# Patient Record
Sex: Male | Born: 1935 | Race: White | Hispanic: No | Smoking: Current every day smoker
Health system: Southern US, Community
[De-identification: ages and names within clinical notes are randomized; demographics above are authoritative.]

## PROBLEM LIST (undated history)

## (undated) DIAGNOSIS — H3530 Unspecified macular degeneration: Secondary | ICD-10-CM

## (undated) DIAGNOSIS — I10 Essential (primary) hypertension: Secondary | ICD-10-CM

## (undated) DIAGNOSIS — H5440 Blindness, one eye, unspecified eye: Secondary | ICD-10-CM

## (undated) HISTORY — PX: COLONOSCOPY: SHX174

## (undated) HISTORY — PX: APPENDECTOMY: SHX54

## (undated) HISTORY — PX: JOINT REPLACEMENT: SHX530

## (undated) HISTORY — PX: ARTHROTOMY: SHX134

## (undated) HISTORY — PX: ELBOW FLEXORPLASTY: SUR611

## (undated) HISTORY — PX: PROSTATE SURGERY: SHX751

## (undated) HISTORY — PX: FRACTURE SURGERY: SHX138

## (undated) HISTORY — PX: ARM DEBRIDEMENT: SHX890

## (undated) HISTORY — PX: MUSCLE FLAP MYOCUTANEOUS / FASCIOCUTANEOUS OF TRUNK: SUR869

## (undated) HISTORY — PX: ESOPHAGOGASTRODUODENOSCOPY: SHX1529

## (undated) HISTORY — PX: TONSILECTOMY, ADENOIDECTOMY, BILATERAL MYRINGOTOMY AND TUBES: SHX2538

## (undated) HISTORY — PX: EXCISION BONE CYST: SHX6616

---

## 2003-09-14 ENCOUNTER — Other Ambulatory Visit: Payer: Self-pay

## 2004-08-24 ENCOUNTER — Ambulatory Visit: Payer: Self-pay | Admitting: Internal Medicine

## 2005-12-14 ENCOUNTER — Ambulatory Visit: Payer: Self-pay | Admitting: Gastroenterology

## 2008-01-24 ENCOUNTER — Ambulatory Visit: Payer: Self-pay | Admitting: Cardiology

## 2011-08-22 ENCOUNTER — Ambulatory Visit: Payer: Self-pay | Admitting: Ophthalmology

## 2011-10-19 ENCOUNTER — Ambulatory Visit: Payer: Self-pay | Admitting: Gastroenterology

## 2012-12-11 ENCOUNTER — Ambulatory Visit: Payer: Self-pay | Admitting: Unknown Physician Specialty

## 2013-01-08 ENCOUNTER — Ambulatory Visit: Payer: Self-pay

## 2013-03-11 ENCOUNTER — Ambulatory Visit: Payer: Self-pay

## 2013-12-18 DIAGNOSIS — I2510 Atherosclerotic heart disease of native coronary artery without angina pectoris: Secondary | ICD-10-CM | POA: Insufficient documentation

## 2017-06-26 DIAGNOSIS — N401 Enlarged prostate with lower urinary tract symptoms: Secondary | ICD-10-CM | POA: Insufficient documentation

## 2017-06-26 DIAGNOSIS — J441 Chronic obstructive pulmonary disease with (acute) exacerbation: Secondary | ICD-10-CM | POA: Insufficient documentation

## 2017-06-26 DIAGNOSIS — J449 Chronic obstructive pulmonary disease, unspecified: Secondary | ICD-10-CM | POA: Insufficient documentation

## 2017-06-26 DIAGNOSIS — R7303 Prediabetes: Secondary | ICD-10-CM | POA: Insufficient documentation

## 2017-06-26 DIAGNOSIS — I10 Essential (primary) hypertension: Secondary | ICD-10-CM | POA: Insufficient documentation

## 2017-06-26 DIAGNOSIS — G4733 Obstructive sleep apnea (adult) (pediatric): Secondary | ICD-10-CM | POA: Insufficient documentation

## 2017-06-26 DIAGNOSIS — E782 Mixed hyperlipidemia: Secondary | ICD-10-CM | POA: Insufficient documentation

## 2017-06-26 DIAGNOSIS — R351 Nocturia: Secondary | ICD-10-CM | POA: Insufficient documentation

## 2017-10-23 DIAGNOSIS — I480 Paroxysmal atrial fibrillation: Secondary | ICD-10-CM | POA: Insufficient documentation

## 2019-05-09 ENCOUNTER — Other Ambulatory Visit: Payer: Self-pay

## 2019-05-09 ENCOUNTER — Emergency Department: Payer: Medicare Other

## 2019-05-09 ENCOUNTER — Encounter: Payer: Self-pay | Admitting: Intensive Care

## 2019-05-09 ENCOUNTER — Emergency Department
Admission: EM | Admit: 2019-05-09 | Discharge: 2019-05-10 | Disposition: A | Discharge status: Home / Self Care | Payer: Medicare Other | Attending: Emergency Medicine | Admitting: Emergency Medicine

## 2019-05-09 DIAGNOSIS — S53105A Unspecified dislocation of left ulnohumeral joint, initial encounter: Secondary | ICD-10-CM | POA: Diagnosis not present

## 2019-05-09 DIAGNOSIS — Y999 Unspecified external cause status: Secondary | ICD-10-CM | POA: Diagnosis not present

## 2019-05-09 DIAGNOSIS — R2232 Localized swelling, mass and lump, left upper limb: Secondary | ICD-10-CM | POA: Insufficient documentation

## 2019-05-09 DIAGNOSIS — Y929 Unspecified place or not applicable: Secondary | ICD-10-CM | POA: Diagnosis not present

## 2019-05-09 DIAGNOSIS — Y939 Activity, unspecified: Secondary | ICD-10-CM | POA: Diagnosis not present

## 2019-05-09 DIAGNOSIS — Z79899 Other long term (current) drug therapy: Secondary | ICD-10-CM | POA: Insufficient documentation

## 2019-05-09 DIAGNOSIS — X58XXXA Exposure to other specified factors, initial encounter: Secondary | ICD-10-CM | POA: Diagnosis not present

## 2019-05-09 DIAGNOSIS — F17210 Nicotine dependence, cigarettes, uncomplicated: Secondary | ICD-10-CM | POA: Diagnosis not present

## 2019-05-09 DIAGNOSIS — S59902A Unspecified injury of left elbow, initial encounter: Secondary | ICD-10-CM | POA: Diagnosis present

## 2019-05-09 DIAGNOSIS — M25422 Effusion, left elbow: Secondary | ICD-10-CM

## 2019-05-09 DIAGNOSIS — I10 Essential (primary) hypertension: Secondary | ICD-10-CM | POA: Insufficient documentation

## 2019-05-09 HISTORY — DX: Blindness, one eye, unspecified eye: H54.40

## 2019-05-09 HISTORY — DX: Unspecified macular degeneration: H35.30

## 2019-05-09 HISTORY — DX: Essential (primary) hypertension: I10

## 2019-05-09 LAB — CBC WITH DIFFERENTIAL/PLATELET
Abs Immature Granulocytes: 0.05 10*3/uL (ref 0.00–0.07)
Basophils Absolute: 0 10*3/uL (ref 0.0–0.1)
Basophils Relative: 1 %
Eosinophils Absolute: 0 10*3/uL (ref 0.0–0.5)
Eosinophils Relative: 0 %
HCT: 45.9 % (ref 39.0–52.0)
Hemoglobin: 15.8 g/dL (ref 13.0–17.0)
Immature Granulocytes: 1 %
Lymphocytes Relative: 10 %
Lymphs Abs: 0.8 10*3/uL (ref 0.7–4.0)
MCH: 31.7 pg (ref 26.0–34.0)
MCHC: 34.4 g/dL (ref 30.0–36.0)
MCV: 92 fL (ref 80.0–100.0)
Monocytes Absolute: 1 10*3/uL (ref 0.1–1.0)
Monocytes Relative: 12 %
Neutro Abs: 6.2 10*3/uL (ref 1.7–7.7)
Neutrophils Relative %: 76 %
Platelets: 333 10*3/uL (ref 150–400)
RBC: 4.99 MIL/uL (ref 4.22–5.81)
RDW: 12.6 % (ref 11.5–15.5)
WBC: 8.1 10*3/uL (ref 4.0–10.5)
nRBC: 0 % (ref 0.0–0.2)

## 2019-05-09 LAB — COMPREHENSIVE METABOLIC PANEL
ALT: 24 U/L (ref 0–44)
AST: 25 U/L (ref 15–41)
Albumin: 3.3 g/dL — ABNORMAL LOW (ref 3.5–5.0)
Alkaline Phosphatase: 88 U/L (ref 38–126)
Anion gap: 13 (ref 5–15)
BUN: 23 mg/dL (ref 8–23)
CO2: 24 mmol/L (ref 22–32)
Calcium: 9.4 mg/dL (ref 8.9–10.3)
Chloride: 99 mmol/L (ref 98–111)
Creatinine, Ser: 1.29 mg/dL — ABNORMAL HIGH (ref 0.61–1.24)
GFR calc Af Amer: 59 mL/min — ABNORMAL LOW (ref >60)
GFR calc non Af Amer: 51 mL/min — ABNORMAL LOW (ref >60)
Glucose, Bld: 121 mg/dL — ABNORMAL HIGH (ref 70–99)
Potassium: 4.2 mmol/L (ref 3.5–5.1)
Sodium: 136 mmol/L (ref 135–145)
Total Bilirubin: 2.6 mg/dL — ABNORMAL HIGH (ref 0.3–1.2)
Total Protein: 7.3 g/dL (ref 6.5–8.1)

## 2019-05-09 LAB — LACTIC ACID, PLASMA: Lactic Acid, Venous: 1.5 mmol/L (ref 0.5–1.9)

## 2019-05-09 LAB — SEDIMENTATION RATE: Sed Rate: 73 mm/hr — ABNORMAL HIGH (ref 0–20)

## 2019-05-09 NOTE — ED Triage Notes (Signed)
Patient presents with severe left elbow/arm swelling, redness, and pain X2 weeks. HX same Patient states "It is fluid buildup" Reports he has never been to MD in past when this would flare up but it would always get better and states "It has never been this bad"

## 2019-05-09 NOTE — ED Notes (Signed)
Left elbow elevated on to a pillow - pt reports some relief

## 2019-05-09 NOTE — ED Provider Notes (Signed)
[facility] Emergency Department Provider Note   ____________________________________________   I have reviewed the triage vital signs and the nursing notes.   HISTORY  Chief Complaint Arm Swelling (left)   History limited by: Not Limited   HPI Corey Wong is a 85 y.o. male who presents to the emergency department today because of concerns for left elbow swelling and discomfort.  Patient states he has had problems with his left elbow for years and years.  Usually will get some swelling in it but will go away after 2 to 3 days.  However this past 2 weeks he has had the swelling there.  He has noticed redness.  He broke his left elbow when he was 85 years old and then had a surgery in the early 60s he says to release a tendon to help his fingers.  Denies any recent trauma to his elbow.  Records reviewed. Per medical record review patient has a history of hypertension.  Past Medical History:  Diagnosis Date  . Blind left eye   . Hypertension   . Macular degeneration of right eye     There are no problems to display for this patient.   History reviewed. No pertinent surgical history.  Prior to Admission medications   Medication Sig Start Date End Date Taking? Authorizing Provider  atorvastatin (LIPITOR) 20 MG tablet Take 20 mg by mouth 1 day or 1 dose. 06/25/18  Yes [provider]  furosemide (LASIX) 20 MG tablet Take 40 mg by mouth 1 day or 1 dose. 06/27/18  Yes [provider]  metoprolol succinate (TOPROL-XL) 25 MG 24 hr tablet Take 25 mg by mouth 1 day or 1 dose. 06/25/18  Yes [provider]  albuterol (VENTOLIN HFA) 108 (90 Base) MCG/ACT inhaler Inhale 2 puffs into the lungs 4 (four) times daily as needed.    [provider]    Allergies Sulfa antibiotics  History reviewed. No pertinent family history.  Social History Social History   Tobacco Use  . Smoking status: Current Every Day Smoker    Types:  Cigarettes  . Smokeless tobacco: Never Used  Substance Use Topics  . Alcohol use: Never  . Drug use: Never    Review of Systems Constitutional: No fever/chills Eyes: No visual changes. ENT: No sore throat. Cardiovascular: Denies chest pain. Respiratory: Denies shortness of breath. Gastrointestinal: No abdominal pain.  No nausea, no vomiting.  No diarrhea.   Genitourinary: Negative for dysuria. Musculoskeletal: Positive for left elbow swelling and discomfort.  Skin: Positive for rash. Neurological: Negative for headaches, focal weakness or numbness.  ____________________________________________   PHYSICAL EXAM:  VITAL SIGNS: ED Triage Vitals  Enc Vitals Group     BP 05/09/19 1646 (!) 142/75     Pulse Rate 05/09/19 1646 95     Resp 05/09/19 1646 18     Temp 05/09/19 1646 98.5 F (36.9 C)     Temp Source 05/09/19 1646 Oral     SpO2 05/09/19 1646 95 %     Weight 05/09/19 1654 250 lb (113.4 kg)     Height 05/09/19 1654 5\' 8"  (1.727 m)     Head Circumference --      Peak Flow --      Pain Score 05/09/19 1654 10   Constitutional: Alert and oriented.  Eyes: Conjunctivae are normal.  ENT      Head: Normocephalic and atraumatic.      Nose: No congestion/rhinnorhea.      Mouth/Throat:  Mucous membranes are moist.      Neck: No stridor. Hematological/Lymphatic/Immunilogical: No cervical lymphadenopathy. Cardiovascular: Normal rate, regular rhythm.  No murmurs, rubs, or gallops.  Respiratory: Normal respiratory effort without tachypnea nor retractions. Breath sounds are clear and equal bilaterally. No wheezes/rales/rhonchi. Gastrointestinal: Soft and non tender. No rebound. No guarding.  Genitourinary: Deferred Musculoskeletal: Significantly swollen left elbow. Two areas of fluctuant swelling medially. Erythema overlying elbow.  Neurologic:  Normal speech and language. No gross focal neurologic deficits are appreciated.  Skin:  Skin is warm, dry and intact. No rash  noted. Psychiatric: Mood and affect are normal. Speech and behavior are normal. Patient exhibits appropriate insight and judgment.  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 Diffuse edema.  ____________________________________________   PROCEDURES  Procedures  ____________________________________________   INITIAL IMPRESSION / ASSESSMENT AND PLAN / ED COURSE  Pertinent labs & imaging results that were available during my care of the patient were reviewed by me and considered in my medical decision making (see chart for details).   Patient presented to the emergency department today because of concerns for left elbow swelling and discomfort.  Patient states he has a long history of left elbow issues and the knee will get swelling occasionally.  On exam patient does have significant swelling to the left elbow.  There are some areas that are concerning for fluctuance.  X-ray shows chronic distal location and destructive arthropathy.  I did discuss with Dr. Gaspar Bidding with orthopedics who reviewed the images. At this time he did not think any attempt at drainage would be beneficial. Recommended checking CRP. Did think patient would benefit from follow up with tertiary care. Will give patient for information for [facility] orthopedics. Will give patient prescription for antibiotics.    ____________________________________________   FINAL CLINICAL IMPRESSION(S) / ED DIAGNOSES  Final diagnoses:  Elbow swelling, left  Dislocation of left elbow, initial encounter     Note: This dictation was prepared with Dragon dictation. Any transcriptional errors that result from this  process are unintentional     Nance Pear, MD 05/10/19 0009

## 2019-05-09 NOTE — ED Triage Notes (Signed)
presents with left elbow pain and swelling

## 2019-05-10 ENCOUNTER — Telehealth: Payer: Self-pay | Admitting: Orthopaedic Surgery

## 2019-05-10 DIAGNOSIS — S53105A Unspecified dislocation of left ulnohumeral joint, initial encounter: Secondary | ICD-10-CM | POA: Diagnosis not present

## 2019-05-10 LAB — C-REACTIVE PROTEIN: CRP: 19.7 mg/dL — ABNORMAL HIGH (ref <1.0)

## 2019-05-10 MED ORDER — CEPHALEXIN 500 MG PO CAPS: 500.0000 mg | ORAL_CAPSULE | Freq: Four times a day (QID) | ORAL | 0 refills | Status: AC

## 2019-05-10 MED ORDER — CEPHALEXIN 500 MG PO CAPS
500.0000 mg | ORAL_CAPSULE | Freq: Once | ORAL | Status: AC
  Administered 2019-05-10: 500 mg via ORAL
  Filled 2019-05-10: qty 1

## 2019-05-10 NOTE — Telephone Encounter (Signed)
-  DELAYED ENTRY-  Spoke with ED provider, Dr. Derrill Kay, on the evening of 05/09/19. Pt presents w/ L elbow swelling. Per report the patient has had intermittent swelling for longer than he can remember. Notes that it is not worse than previous, just has lasted longer. No f/c/n/v systemic symptoms. Per report the patient is not ill appearing/feeling, he is afebrile, he is able to move his elbow at his baseline level and does not have a white count. I have reviewed his xrays and it appears he has a destructive arthropathy of his left elbow that is chronic. With his elevated inflammatory markers drawn after our conversation this would favor an acute exacerbation of chronic osteomyelitis without any clinical indications of sepsis vs less likely neuorpathic joint. Dr. Derrill Kay and I discussed that this is a complex case best suited for a tertiary care center with an upper extremity specialist and infectious disease. There is no indication for urgent I&D or transfer at this time, but I recommended strict return to ED precautions and close follow up. I do not recommend bedside I&D as this would only create a draining sinus for the patient and complicate treatment further for the definitive surgeon.   Mena Goes, MD

## 2019-05-10 NOTE — Discharge Instructions (Addendum)
It is very important that you follow up with [facility]. Please seek medical attention for any fevers, worsening pain or swelling, decreased sensation in your hand or any other new or concerning symptoms.

## 2019-05-10 NOTE — ED Notes (Signed)
No peripheral IV placed this visit.   Discharge instructions reviewed with patient. Questions fielded by this RN. Patient verbalizes understanding of instructions. Patient discharged home in stable condition per [facility]. No acute distress noted at time of discharge.

## 2019-05-10 NOTE — ED Notes (Signed)
Family updated as to patient's status.  Cyndi Lennert, called for pt pickup

## 2019-05-19 ENCOUNTER — Other Ambulatory Visit: Payer: Self-pay | Admitting: Gerontology

## 2019-05-19 ENCOUNTER — Other Ambulatory Visit: Payer: Self-pay

## 2019-05-19 ENCOUNTER — Ambulatory Visit
Admission: RE | Admit: 2019-05-19 | Discharge: 2019-05-19 | Disposition: A | Discharge status: Home / Self Care | Payer: Medicare Other | Source: Ambulatory Visit | Attending: Gerontology | Admitting: Gerontology

## 2019-05-19 DIAGNOSIS — M869 Osteomyelitis, unspecified: Secondary | ICD-10-CM

## 2019-05-19 MED ORDER — GADOBUTROL 1 MMOL/ML IV SOLN
10.0000 mL | Freq: Once | INTRAVENOUS | Status: AC | PRN
  Administered 2019-05-19: 10 mL via INTRAVENOUS

## 2019-05-29 ENCOUNTER — Ambulatory Visit: Payer: Medicare Other | Admitting: Infectious Diseases

## 2019-07-11 DIAGNOSIS — D62 Acute posthemorrhagic anemia: Secondary | ICD-10-CM | POA: Insufficient documentation

## 2019-07-11 DIAGNOSIS — I4510 Unspecified right bundle-branch block: Secondary | ICD-10-CM | POA: Insufficient documentation

## 2019-07-11 DIAGNOSIS — I5031 Acute diastolic (congestive) heart failure: Secondary | ICD-10-CM | POA: Insufficient documentation

## 2019-07-11 DIAGNOSIS — N183 Chronic kidney disease, stage 3 unspecified: Secondary | ICD-10-CM | POA: Insufficient documentation

## 2019-09-02 DIAGNOSIS — Z09 Encounter for follow-up examination after completed treatment for conditions other than malignant neoplasm: Secondary | ICD-10-CM | POA: Insufficient documentation

## 2019-10-08 ENCOUNTER — Ambulatory Visit
Admission: RE | Admit: 2019-10-08 | Discharge: 2019-10-08 | Disposition: A | Discharge status: Home / Self Care | Payer: Medicare Other | Source: Ambulatory Visit | Attending: Family Medicine | Admitting: Family Medicine

## 2019-10-08 ENCOUNTER — Encounter (INDEPENDENT_AMBULATORY_CARE_PROVIDER_SITE_OTHER): Payer: Self-pay

## 2019-10-08 ENCOUNTER — Other Ambulatory Visit: Payer: Self-pay

## 2019-10-08 ENCOUNTER — Other Ambulatory Visit: Payer: Self-pay | Admitting: Family Medicine

## 2019-10-08 DIAGNOSIS — M86622 Other chronic osteomyelitis, left  humerus: Secondary | ICD-10-CM | POA: Diagnosis present

## 2019-10-21 ENCOUNTER — Encounter (INDEPENDENT_AMBULATORY_CARE_PROVIDER_SITE_OTHER): Payer: Self-pay | Admitting: Vascular Surgery

## 2019-10-21 ENCOUNTER — Other Ambulatory Visit: Payer: Self-pay

## 2019-10-21 ENCOUNTER — Ambulatory Visit (INDEPENDENT_AMBULATORY_CARE_PROVIDER_SITE_OTHER): Payer: Medicare Other | Admitting: Vascular Surgery

## 2019-10-21 VITALS — BP 105/59 | HR 87 | Resp 16 | Ht 68.0 in | Wt 233.0 lb

## 2019-10-21 DIAGNOSIS — E782 Mixed hyperlipidemia: Secondary | ICD-10-CM | POA: Diagnosis not present

## 2019-10-21 DIAGNOSIS — H3530 Unspecified macular degeneration: Secondary | ICD-10-CM | POA: Insufficient documentation

## 2019-10-21 DIAGNOSIS — I10 Essential (primary) hypertension: Secondary | ICD-10-CM

## 2019-10-21 DIAGNOSIS — N183 Chronic kidney disease, stage 3 unspecified: Secondary | ICD-10-CM

## 2019-10-21 DIAGNOSIS — H409 Unspecified glaucoma: Secondary | ICD-10-CM | POA: Insufficient documentation

## 2019-10-21 DIAGNOSIS — I5033 Acute on chronic diastolic (congestive) heart failure: Secondary | ICD-10-CM | POA: Diagnosis not present

## 2019-10-21 DIAGNOSIS — N320 Bladder-neck obstruction: Secondary | ICD-10-CM | POA: Insufficient documentation

## 2019-10-21 DIAGNOSIS — F17200 Nicotine dependence, unspecified, uncomplicated: Secondary | ICD-10-CM | POA: Insufficient documentation

## 2019-10-21 DIAGNOSIS — M7989 Other specified soft tissue disorders: Secondary | ICD-10-CM

## 2019-10-21 DIAGNOSIS — R600 Localized edema: Secondary | ICD-10-CM | POA: Insufficient documentation

## 2019-10-21 NOTE — Progress Notes (Signed)
Patient ID: Corey Wong, male   DOB: 04-17-1934, 85 y.o.   MRN: [id number]  Chief Complaint  Patient presents with  . New Patient (Initial Visit)    left leg edema    HPI Corey Wong is a 85 y.o. male.  I am asked to see the patient by Dr. Harrington Challenger for evaluation of marked leg swelling.  The patient has significant medical comorbidities and has had fairly significant decline in his activity level and overall condition over many months.  He now states that he barely walks.  He was admitted with acute on chronic heart failure earlier this year and has chronic heart failure at a baseline.  He also has some degree of chronic kidney disease.  His leg swelling is quite prominent.  Apparently this has been an on and off problem for many years and he was seen in our office 7 years ago although we have no current records mediately available on that because that predates our current computer system.  Both legs are affected but the left leg may be a little worse.  He can barely lift his legs at this point.  The skin is tense and thickened with some blistering although I do not see any open wounds draining at this point.  He describes these as heavy and painful.  The swelling goes all the way up into the thighs.  The posterior portions of the thighs are quite firm and indurated where he sits in his wheelchair most of the day.  He does not really elevate his legs much.  Nothing that he can pinpoint makes this any better.   Past Medical History:  Diagnosis Date  . Blind left eye   . Hypertension   . Macular degeneration of right eye     Past Surgical History:  Procedure Laterality Date  . APPENDECTOMY    . ARM DEBRIDEMENT Left   . ARTHROTOMY    . COLONOSCOPY    . ELBOW FLEXORPLASTY    . ESOPHAGOGASTRODUODENOSCOPY    . EXCISION BONE CYST    . JOINT REPLACEMENT    . MUSCLE FLAP MYOCUTANEOUS / FASCIOCUTANEOUS OF TRUNK    . PROSTATE SURGERY    . TONSILECTOMY, ADENOIDECTOMY, BILATERAL MYRINGOTOMY  AND TUBES       Family History No bleeding disorders, clotting disorders, autoimmune diseases, or aneurysms   Social History   Tobacco Use  . Smoking status: Current Every Day Smoker    Types: Cigarettes  . Smokeless tobacco: Never Used  Substance Use Topics  . Alcohol use: Never  . Drug use: Never    Allergies  Allergen Reactions  . Linezolid Other (See Comments)    Sore on the inside of mouth and lips   . Sulfa Antibiotics Other (See Comments)    Other Reaction: hyperactivity    Current Outpatient Medications  Medication Sig Dispense Refill  . acetaminophen (TYLENOL) 325 MG tablet Take by mouth.    Marland Kitchen albuterol (VENTOLIN HFA) 108 (90 Base) MCG/ACT inhaler Inhale 2 puffs into the lungs 4 (four) times daily as needed.    Marland Kitchen aspirin 81 MG EC tablet Take by mouth.    Marland Kitchen atorvastatin (LIPITOR) 20 MG tablet Take 20 mg by mouth 1 day or 1 dose.    . brimonidine (ALPHAGAN P) 0.1 % SOLN Apply to eye.    . ciprofloxacin (CIPRO) 500 MG tablet Take 500 mg by mouth 2 (two) times daily.    . clindamycin (CLEOCIN) 150 MG capsule  Take 150 mg by mouth 3 (three) times daily.    . furosemide (LASIX) 20 MG tablet Take 40 mg by mouth 1 day or 1 dose.    Marland Kitchen. LUMIGAN 0.01 % SOLN     . metoprolol succinate (TOPROL-XL) 25 MG 24 hr tablet Take 25 mg by mouth 1 day or 1 dose.    Marland Kitchen. MIRAPEX ER 0.375 MG TB24 Take by mouth.    . polyethylene glycol powder (GLYCOLAX/MIRALAX) 17 GM/SCOOP powder Take by mouth.    . sertraline (ZOLOFT) 25 MG tablet Take 25 mg by mouth daily.    . Soft Lens Products (RA SALINE SOLUTION) SOLN     . timolol (TIMOPTIC) 0.5 % ophthalmic solution     . WIXELA INHUB 250-50 MCG/DOSE AEPB Inhale 1 puff into the lungs 2 (two) times daily.     No current facility-administered medications for this visit.      REVIEW OF SYSTEMS (Negative unless checked)  Constitutional: [] Weight loss  [] Fever  [] Chills Cardiac: [] Chest pain   [] Chest pressure   [x] Palpitations   [] Shortness of  breath when laying flat   [] Shortness of breath at rest   [x] Shortness of breath with exertion. Vascular:  [] Pain in legs with walking   [] Pain in legs at rest   [] Pain in legs when laying flat   [] Claudication   [] Pain in feet when walking  [] Pain in feet at rest  [] Pain in feet when laying flat   [] History of DVT   [] Phlebitis   [x] Swelling in legs   [] Varicose veins   [] Non-healing ulcers Pulmonary:   [] Uses home oxygen   [] Productive cough   [] Hemoptysis   [] Wheeze  [] COPD   [] Asthma Neurologic:  [] Dizziness  [] Blackouts   [] Seizures   [] History of stroke   [] History of TIA  [] Aphasia   [] Temporary blindness   [] Dysphagia   [] Weakness or numbness in arms   [] Weakness or numbness in legs Musculoskeletal:  [x] Arthritis   [] Joint swelling   [x] Joint pain   [] Low back pain Hematologic:  [] Easy bruising  [] Easy bleeding   [] Hypercoagulable state   [] Anemic  [] Hepatitis Gastrointestinal:  [] Blood in stool   [] Vomiting blood  [] Gastroesophageal reflux/heartburn   [] Abdominal pain Genitourinary:  [x] Chronic kidney disease   [] Difficult urination  [] Frequent urination  [] Burning with urination   [] Hematuria Skin:  [] Rashes   [] Ulcers   [] Wounds Psychological:  [] History of anxiety   []  History of major depression.    Physical Exam BP (!) 105/59 (BP Location: Right Arm)   Pulse 87   Resp 16   Ht 5\' 8"  (1.727 m)   Wt 233 lb (105.7 kg)   BMI 35.43 kg/m  Gen:  WD/WN, NAD.  Somewhat debilitated appearing Head: [place]/AT, No temporalis wasting. Ear/Nose/Throat: Hearing somewhat diminished, nares w/o erythema or drainage, oropharynx w/o Erythema/Exudate Eyes: Conjunctiva clear, sclera non-icteric  Neck: trachea midline.  No JVD.  Pulmonary:  Good air movement, respirations not labored, no use of accessory muscles  Cardiac: Somewhat irregular Vascular:  Vessel Right Left  Radial Palpable Palpable                          DP  NP  NP  PT  NP  NP   Gastrointestinal:. No masses, surgical  incisions, or scars. Musculoskeletal: M/S 5/5 throughout.  Extremities without ischemic changes.  No deformity or atrophy.  In a wheelchair.  Marked stasis dermatitis changes are present bilaterally with some blistering  and induration of the skin.  3+ right lower extremity edema, 3-4+ left lower extremity edema. Neurologic: Sensation grossly intact in extremities.  Symmetrical.  Speech is fluent. Motor exam as listed above. Psychiatric: Judgment intact, Mood & affect appropriate for pt's clinical situation. Dermatologic: No rashes or ulcers noted.  No cellulitis or open wounds.    Radiology US Venous Img Upper Uni Left (DVT)  Result Date: 10/08/2019 CLINICAL DATA:  Pain and edema the left upper extremity with history of osteomyelitis of the left elbow. EXAM: LEFT UPPER EXTREMITY VENOUS DOPPLER ULTRASOUND TECHNIQUE: Gray-scale sonography with graded compression, as well as color Doppler and duplex ultrasound were performed to evaluate the upper extremity deep venous system from the level of the subclavian vein and including the jugular, axillary, basilic, radial, ulnar and upper cephalic vein. Spectral Doppler was utilized to evaluate flow at rest and with distal augmentation maneuvers. COMPARISON:  MRI of the left elbow on 05/19/2019 FINDINGS: Contralateral Subclavian Vein: Respiratory phasicity is normal and symmetric with the symptomatic side. No evidence of thrombus. Normal compressibility. Internal Jugular Vein: No evidence of thrombus. Normal compressibility, respiratory phasicity and response to augmentation. Subclavian Vein: No evidence of thrombus. Normal compressibility, respiratory phasicity and response to augmentation. Axillary Vein: No evidence of thrombus. Normal compressibility, respiratory phasicity and response to augmentation. Cephalic Vein: No evidence of thrombus. Normal compressibility, respiratory phasicity and response to augmentation. Basilic Vein: No evidence of thrombus. Normal  compressibility, respiratory phasicity and response to augmentation. Brachial Veins: No evidence of thrombus. Normal compressibility, respiratory phasicity and response to augmentation. Radial Veins: No evidence of thrombus. Normal compressibility, respiratory phasicity and response to augmentation. Ulnar Veins: No evidence of thrombus. Normal compressibility, respiratory phasicity and response to augmentation. Venous Reflux:  None visualized. Other Findings: No evidence of superficial thrombophlebitis. There are visible complex fluid collection centered around the left elbow. IMPRESSION: 1. No evidence of DVT in the left upper extremity. 2. 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 Electronically Signed   By: Irish Lack M.D.   On: 10/08/2019 15:54    Labs No results found for this or any previous visit (from the past 2160 hour(s)).  Assessment/Plan:  Essential hypertension blood pressure control important in reducing the progression of atherosclerotic disease. On appropriate oral medications.   Mixed hyperlipidemia lipid control important in reducing the progression of atherosclerotic disease. Continue statin therapy   Chronic kidney disease (CKD), stage III (moderate) Certainly can be a contributing factor to his lower extremity swelling.  Also has to be careful with the use of diuretics for fear of renal insufficiency.  Heart failure, diastolic, acute on chronic (HCC) Poor cardiac function can certainly worsen lower extremity swelling.  Swelling of limb I have had a long discussion with the patient regarding swelling and why it  causes symptoms.  His swelling is far too severe currently to realistically expect him to get compression stockings on.  We are going to wrap both lower extremities in Unna boots today and plan to change these weekly.  Once he comes out of the  [employer], patient will begin wearing graduated compression stockings class 1 (20-30 mmHg) on a daily basis a prescription was given. The patient will  beginning wearing the stockings first thing in the morning and removing them in the evening. The patient is instructed specifically not to sleep in the stockings.   In addition, behavioral modification will  be initiated.  This will include frequent elevation, use of over the counter pain medications and exercise such as walking.  I have reviewed systemic causes for chronic edema such as liver, kidney and cardiac etiologies.  The patient denies problems with these organ systems.    Consideration for a lymph pump will also be made based upon the effectiveness of conservative therapy.  This would help to improve the edema control and prevent sequela such as ulcers and infections   Patient should undergo duplex ultrasound of the venous system to ensure that DVT or reflux is not present.  The patient will follow-up with me after the ultrasound.        Festus Barren 10/21/2019, 1:34 PM   This note was created with Dragon medical transcription system.  Any errors from dictation are unintentional.

## 2019-10-21 NOTE — Patient Instructions (Signed)
Edema  Edema is when you have too much fluid in your body or under your skin. Edema may make your legs, feet, and ankles swell up. Swelling is also common in looser tissues, like around your eyes. This is a common condition. It gets more common as you get older. There are many possible causes of edema. Eating too much salt (sodium) and being on your feet or sitting for a long time can cause edema in your legs, feet, and ankles. Hot weather may make edema worse. Edema is usually painless. Your skin may look swollen or shiny. Follow these instructions at home:  Keep the swollen body part raised (elevated) above the level of your heart when you are sitting or lying down.  Do not sit still or stand for a long time.  Do not wear tight clothes. Do not wear garters on your upper legs.  Exercise your legs. This can help the swelling go down.  Wear elastic bandages or support stockings as told by your doctor.  Eat a low-salt (low-sodium) diet to reduce fluid as told by your doctor.  Depending on the cause of your swelling, you may need to limit how much fluid you drink (fluid restriction).  Take over-the-counter and prescription medicines only as told by your doctor. Contact a doctor if:  Treatment is not working.  You have heart, liver, or kidney disease and have symptoms of edema.  You have sudden and unexplained weight gain. Get help right away if:  You have shortness of breath or chest pain.  You cannot breathe when you lie down.  You have pain, redness, or warmth in the swollen areas.  You have heart, liver, or kidney disease and get edema all of a sudden.  You have a fever and your symptoms get worse all of a sudden. Summary  Edema is when you have too much fluid in your body or under your skin.  Edema may make your legs, feet, and ankles swell up. Swelling is also common in looser tissues, like around your eyes.  Raise (elevate) the swollen body part above the level of your  heart when you are sitting or lying down.  Follow your doctor's instructions about diet and how much fluid you can drink (fluid restriction). This information is not intended to replace advice given to you by your health care provider. Make sure you discuss any questions you have with your health care provider. Document Revised: 03/23/2017 Document Reviewed: 04/07/2016 Elsevier Patient Education  2020 [employer]  

## 2019-10-21 NOTE — Assessment & Plan Note (Signed)
blood pressure control important in reducing the progression of atherosclerotic disease. On appropriate oral medications.  

## 2019-10-21 NOTE — Assessment & Plan Note (Signed)
lipid control important in reducing the progression of atherosclerotic disease. Continue statin therapy  

## 2019-10-21 NOTE — Assessment & Plan Note (Signed)
Poor cardiac function can certainly worsen lower extremity swelling. 

## 2019-10-21 NOTE — Assessment & Plan Note (Signed)
I have had a long discussion with the patient regarding swelling and why it  causes symptoms.  His swelling is far too severe currently to realistically expect him to get compression stockings on.  We are going to wrap both lower extremities in Unna boots today and plan to change these weekly.  Once he comes out of the [employer], patient will begin wearing graduated compression stockings class 1 (20-30 mmHg) on a daily basis a prescription was given. The patient will  beginning wearing the stockings first thing in the morning and removing them in the evening. The patient is instructed specifically not to sleep in the stockings.   In addition, behavioral modification will be initiated.  This will include frequent elevation, use of over the counter pain medications and exercise such as walking.  I have reviewed systemic causes for chronic edema such as liver, kidney and cardiac etiologies.  The patient denies problems with these organ systems.    Consideration for a lymph pump will also be made based upon the effectiveness of conservative therapy.  This would help to improve the edema control and prevent sequela such as ulcers and infections   Patient should undergo duplex ultrasound of the venous system to ensure that DVT or reflux is not present.  The patient will follow-up with me after the ultrasound.

## 2019-10-21 NOTE — Assessment & Plan Note (Signed)
Certainly can be a contributing factor to his lower extremity swelling.  Also has to be careful with the use of diuretics for fear of renal insufficiency.

## 2019-10-22 ENCOUNTER — Other Ambulatory Visit: Payer: Self-pay | Admitting: Infectious Diseases

## 2019-10-22 DIAGNOSIS — M86622 Other chronic osteomyelitis, left  humerus: Secondary | ICD-10-CM

## 2019-10-28 ENCOUNTER — Encounter (INDEPENDENT_AMBULATORY_CARE_PROVIDER_SITE_OTHER): Payer: Self-pay

## 2019-10-28 ENCOUNTER — Ambulatory Visit (INDEPENDENT_AMBULATORY_CARE_PROVIDER_SITE_OTHER): Payer: Medicare Other | Admitting: Nurse Practitioner

## 2019-10-28 ENCOUNTER — Telehealth (INDEPENDENT_AMBULATORY_CARE_PROVIDER_SITE_OTHER): Payer: Self-pay

## 2019-10-28 ENCOUNTER — Other Ambulatory Visit: Payer: Self-pay

## 2019-10-28 VITALS — BP 114/77 | HR 70 | Resp 16

## 2019-10-28 DIAGNOSIS — R600 Localized edema: Secondary | ICD-10-CM

## 2019-10-28 NOTE — Progress Notes (Signed)
History of Present Illness  There is no documented history at this time  Assessments & Plan   There are no diagnoses linked to this encounter.    Additional instructions  Subjective:  Patient presents with venous ulcer of the Bilateral lower extremity.    Procedure:  3 layer unna wrap was placed Bilateral lower extremity.   Plan:   Follow up in one week.  

## 2019-10-28 NOTE — Telephone Encounter (Signed)
Orders were fax to [facility] home health to start bilateral unna wrap weekly and patient has being aware with information.

## 2019-10-29 ENCOUNTER — Encounter (INDEPENDENT_AMBULATORY_CARE_PROVIDER_SITE_OTHER): Payer: Self-pay | Admitting: Nurse Practitioner

## 2019-11-04 ENCOUNTER — Encounter (INDEPENDENT_AMBULATORY_CARE_PROVIDER_SITE_OTHER): Payer: Medicare Other

## 2019-11-11 ENCOUNTER — Encounter (INDEPENDENT_AMBULATORY_CARE_PROVIDER_SITE_OTHER): Payer: Medicare Other

## 2019-11-11 ENCOUNTER — Ambulatory Visit (INDEPENDENT_AMBULATORY_CARE_PROVIDER_SITE_OTHER): Payer: Medicare Other | Admitting: Nurse Practitioner

## 2019-11-12 ENCOUNTER — Ambulatory Visit (INDEPENDENT_AMBULATORY_CARE_PROVIDER_SITE_OTHER): Payer: Medicare Other

## 2019-11-12 ENCOUNTER — Other Ambulatory Visit: Payer: Self-pay

## 2019-11-12 ENCOUNTER — Encounter (INDEPENDENT_AMBULATORY_CARE_PROVIDER_SITE_OTHER): Payer: Self-pay | Admitting: Nurse Practitioner

## 2019-11-12 ENCOUNTER — Ambulatory Visit (INDEPENDENT_AMBULATORY_CARE_PROVIDER_SITE_OTHER): Payer: Medicare Other | Admitting: Nurse Practitioner

## 2019-11-12 VITALS — BP 101/63 | HR 101 | Ht 68.0 in | Wt 236.0 lb

## 2019-11-12 DIAGNOSIS — R600 Localized edema: Secondary | ICD-10-CM

## 2019-11-12 DIAGNOSIS — I10 Essential (primary) hypertension: Secondary | ICD-10-CM

## 2019-11-12 DIAGNOSIS — E782 Mixed hyperlipidemia: Secondary | ICD-10-CM

## 2019-11-12 DIAGNOSIS — F17200 Nicotine dependence, unspecified, uncomplicated: Secondary | ICD-10-CM

## 2019-11-12 DIAGNOSIS — M7989 Other specified soft tissue disorders: Secondary | ICD-10-CM

## 2019-11-17 ENCOUNTER — Encounter (INDEPENDENT_AMBULATORY_CARE_PROVIDER_SITE_OTHER): Payer: Self-pay | Admitting: Nurse Practitioner

## 2019-11-17 NOTE — Progress Notes (Signed)
Subjective:    Patient ID: Corey Wong, male    DOB: 05/22/1934, 85 y.o.   MRN: [id number] Chief Complaint  Patient presents with  . Follow-up    U/S  follow up and Unna boot check    Patient is seen for evaluation of leg swelling. The patient first noticed the swelling remotely but is now concerned because of a significant increase in the overall edema. The swelling is associated with pain and discoloration. The patient notes that in the morning the legs are significantly improved but they steadily worsened throughout the course of the day. Elevation makes the legs better, dependency makes them much worse.   There is no history of ulcerations associated with the swelling.   The patient denies any recent changes in their medications.  The patient has not been wearing graduated compression.  The patient has no had any past angiography, interventions or vascular surgery.  The patient denies a history of DVT or PE. There is no prior history of phlebitis. There is no history of primary lymphedema.  There is no history of radiation treatment to the groin or pelvis No history of malignancies. No history of trauma or groin or pelvic surgery. No history of foreign travel or parasitic infections area   Patient does have a history of CHF.  Today noninvasive study showed no evidence of DVT or superficial venous thrombosis bilaterally. No evidence of deep venous insufficiency or superficial venous reflux bilaterally.   Review of Systems  Cardiovascular: Positive for leg swelling.  All other systems reviewed and are negative.      Objective:   Physical Exam Vitals reviewed.  HENT:     Head: Normocephalic.  Cardiovascular:     Rate and Rhythm: Normal rate and regular rhythm.     Pulses: Normal pulses.  Pulmonary:     Effort: Pulmonary effort is normal.  Musculoskeletal:     Right lower leg: 3+ Edema present.     Left lower leg: 3+ Edema present.  Skin:    General: Skin is  warm and dry.  Neurological:     Mental Status: He is alert and oriented to person, place, and time.  Psychiatric:        Mood and Affect: Mood normal.        Behavior: Behavior normal.        Thought Content: Thought content normal.        Judgment: Judgment normal.     BP 101/63   Pulse (!) 101   Ht 5\' 8"  (1.727 m)   Wt 236 lb (107 kg)   BMI 35.88 kg/m   Past Medical History:  Diagnosis Date  . Blind left eye   . Hypertension   . Macular degeneration of right eye     Social History   Socioeconomic History  . Marital status: Widowed    Spouse name: Not on file  . Number of children: Not on file  . Years of education: Not on file  . Highest education level: Not on file  Occupational History  . Not on file  Tobacco Use  . Smoking status: Current Every Day Smoker    Types: Cigarettes  . Smokeless tobacco: Never Used  Substance and Sexual Activity  . Alcohol use: Never  . Drug use: Never  . Sexual activity: Not on file  Other Topics Concern  . Not on file  Social History Narrative  . Not on file   Social Determinants of Health  Financial Resource Strain:   . Difficulty of Paying Living Expenses:   Food Insecurity:   . Worried About Programme researcher, broadcasting/film/video in the Last Year:   . Barista in the Last Year:   Transportation Needs:   . Freight forwarder (Medical):   Marland Kitchen Lack of Transportation (Non-Medical):   Physical Activity:   . Days of Exercise per Week:   . Minutes of Exercise per Session:   Stress:   . Feeling of Stress :   Social Connections:   . Frequency of Communication with Friends and Family:   . Frequency of Social Gatherings with Friends and Family:   . Attends Religious Services:   . Active Member of Clubs or Organizations:   . Attends Banker Meetings:   Marland Kitchen Marital Status:   Intimate Partner Violence:   . Fear of Current or Ex-Partner:   . Emotionally Abused:   Marland Kitchen Physically Abused:   . Sexually Abused:     Past  Surgical History:  Procedure Laterality Date  . APPENDECTOMY    . ARM DEBRIDEMENT Left   . ARTHROTOMY    . COLONOSCOPY    . ELBOW FLEXORPLASTY    . ESOPHAGOGASTRODUODENOSCOPY    . EXCISION BONE CYST    . JOINT REPLACEMENT    . MUSCLE FLAP MYOCUTANEOUS / FASCIOCUTANEOUS OF TRUNK    . PROSTATE SURGERY    . TONSILECTOMY, ADENOIDECTOMY, BILATERAL MYRINGOTOMY AND TUBES      No family history on file.  Allergies  Allergen Reactions  . Linezolid Other (See Comments)    Sore on the inside of mouth and lips   . Sulfa Antibiotics Other (See Comments) and Rash    Other Reaction: hyperactivity       Assessment & Plan:   1. Lower extremity edema I have had a long discussion with the patient regarding swelling and why it  causes symptoms.  Patient will begin wearing graduated compression stockings class 1 (20-30 mmHg) on a daily basis a prescription was given. The patient will  beginning wearing the stockings first thing in the morning and removing them in the evening. The patient is instructed specifically not to sleep in the stockings.   In addition, behavioral modification will be initiated.  This will include frequent elevation, use of over the counter pain medications and exercise such as walking.  I have reviewed systemic causes for chronic edema such as liver, kidney and cardiac etiologies.  The patient denies problems with these organ systems.    Consideration for a lymph pump will also be made based upon the effectiveness of conservative therapy.  This would help to improve the edema control and prevent sequela such as ulcers and infections  Patient will follow up in 6 months, sooner if issues should arise.   2. Mixed hyperlipidemia Continue statin as ordered and reviewed, no changes at this time   3. Essential hypertension Continue antihypertensive medications as already ordered, these medications have been reviewed and there are no changes at this time.   4. Tobacco  dependence Smoking cessation was discussed, 3-10 minutes spent on this topic specifically    Current Outpatient Medications on File Prior to Visit  Medication Sig Dispense Refill  . albuterol (VENTOLIN HFA) 108 (90 Base) MCG/ACT inhaler Inhale 2 puffs into the lungs 4 (four) times daily as needed.    Marland Kitchen albuterol (VENTOLIN HFA) 108 (90 Base) MCG/ACT inhaler Inhale into the lungs.    Marland Kitchen aspirin 81 MG EC  tablet Take by mouth.    Marland Kitchen atorvastatin (LIPITOR) 20 MG tablet Take 20 mg by mouth 1 day or 1 dose.    . brimonidine (ALPHAGAN P) 0.1 % SOLN Apply to eye.    . ciprofloxacin (CIPRO) 500 MG tablet Take 500 mg by mouth 2 (two) times daily.    . clindamycin (CLEOCIN) 150 MG capsule Take 150 mg by mouth 3 (three) times daily.    . furosemide (LASIX) 20 MG tablet Take 40 mg by mouth 1 day or 1 dose.    Marland Kitchen LUMIGAN 0.01 % SOLN     . metoprolol succinate (TOPROL-XL) 25 MG 24 hr tablet Take 25 mg by mouth 1 day or 1 dose.    Marland Kitchen MIRAPEX ER 0.375 MG TB24 Take by mouth.    . polyethylene glycol powder (GLYCOLAX/MIRALAX) 17 GM/SCOOP powder Take by mouth.    . sertraline (ZOLOFT) 25 MG tablet Take 25 mg by mouth daily.    . Soft Lens Products (RA SALINE SOLUTION) SOLN     . timolol (TIMOPTIC) 0.5 % ophthalmic solution     . WIXELA INHUB 250-50 MCG/DOSE AEPB Inhale 1 puff into the lungs 2 (two) times daily.    Marland Kitchen acetaminophen (TYLENOL) 325 MG tablet Take by mouth. (Patient not taking: Reported on 11/12/2019)    . fluticasone-salmeterol (ADVAIR HFA) 115-21 MCG/ACT inhaler Inhale into the lungs.    . furosemide (LASIX) 10 MG/ML solution Take by mouth.    . metoprolol succinate (TOPROL-XL) 100 MG 24 hr tablet Take by mouth.     No current facility-administered medications on file prior to visit.    There are no Patient Instructions on file for this visit. No follow-ups on file.   Georgiana Spinner, NP

## 2020-04-08 ENCOUNTER — Other Ambulatory Visit: Payer: Self-pay

## 2020-04-08 ENCOUNTER — Inpatient Hospital Stay
Admission: EM | Admit: 2020-04-08 | Discharge: 2020-04-22 | DRG: 291 | Disposition: A | Discharge status: Skilled Nursing Facility | Payer: Medicare Other | Attending: Internal Medicine | Admitting: Internal Medicine

## 2020-04-08 ENCOUNTER — Emergency Department: Payer: Medicare Other

## 2020-04-08 ENCOUNTER — Encounter: Payer: Self-pay | Admitting: Emergency Medicine

## 2020-04-08 DIAGNOSIS — F419 Anxiety disorder, unspecified: Secondary | ICD-10-CM | POA: Diagnosis not present

## 2020-04-08 DIAGNOSIS — F32A Depression, unspecified: Secondary | ICD-10-CM | POA: Diagnosis not present

## 2020-04-08 DIAGNOSIS — J449 Chronic obstructive pulmonary disease, unspecified: Secondary | ICD-10-CM

## 2020-04-08 DIAGNOSIS — D6869 Other thrombophilia: Secondary | ICD-10-CM | POA: Diagnosis not present

## 2020-04-08 DIAGNOSIS — D6859 Other primary thrombophilia: Secondary | ICD-10-CM | POA: Diagnosis present

## 2020-04-08 DIAGNOSIS — G4733 Obstructive sleep apnea (adult) (pediatric): Secondary | ICD-10-CM | POA: Diagnosis present

## 2020-04-08 DIAGNOSIS — I2510 Atherosclerotic heart disease of native coronary artery without angina pectoris: Secondary | ICD-10-CM | POA: Diagnosis present

## 2020-04-08 DIAGNOSIS — Z9119 Patient's noncompliance with other medical treatment and regimen: Secondary | ICD-10-CM

## 2020-04-08 DIAGNOSIS — Z9181 History of falling: Secondary | ICD-10-CM

## 2020-04-08 DIAGNOSIS — I959 Hypotension, unspecified: Secondary | ICD-10-CM | POA: Diagnosis not present

## 2020-04-08 DIAGNOSIS — M25511 Pain in right shoulder: Secondary | ICD-10-CM | POA: Diagnosis present

## 2020-04-08 DIAGNOSIS — I5031 Acute diastolic (congestive) heart failure: Secondary | ICD-10-CM

## 2020-04-08 DIAGNOSIS — Z882 Allergy status to sulfonamides status: Secondary | ICD-10-CM

## 2020-04-08 DIAGNOSIS — R4182 Altered mental status, unspecified: Secondary | ICD-10-CM

## 2020-04-08 DIAGNOSIS — M8668 Other chronic osteomyelitis, other site: Secondary | ICD-10-CM | POA: Diagnosis present

## 2020-04-08 DIAGNOSIS — I2781 Cor pulmonale (chronic): Secondary | ICD-10-CM | POA: Diagnosis present

## 2020-04-08 DIAGNOSIS — D649 Anemia, unspecified: Secondary | ICD-10-CM | POA: Diagnosis not present

## 2020-04-08 DIAGNOSIS — U071 COVID-19: Secondary | ICD-10-CM | POA: Diagnosis not present

## 2020-04-08 DIAGNOSIS — I482 Chronic atrial fibrillation, unspecified: Secondary | ICD-10-CM

## 2020-04-08 DIAGNOSIS — J9622 Acute and chronic respiratory failure with hypercapnia: Secondary | ICD-10-CM | POA: Diagnosis present

## 2020-04-08 DIAGNOSIS — E785 Hyperlipidemia, unspecified: Secondary | ICD-10-CM | POA: Diagnosis present

## 2020-04-08 DIAGNOSIS — J9621 Acute and chronic respiratory failure with hypoxia: Secondary | ICD-10-CM

## 2020-04-08 DIAGNOSIS — Z7982 Long term (current) use of aspirin: Secondary | ICD-10-CM

## 2020-04-08 DIAGNOSIS — N179 Acute kidney failure, unspecified: Secondary | ICD-10-CM | POA: Diagnosis not present

## 2020-04-08 DIAGNOSIS — E669 Obesity, unspecified: Secondary | ICD-10-CM

## 2020-04-08 DIAGNOSIS — R531 Weakness: Secondary | ICD-10-CM | POA: Diagnosis present

## 2020-04-08 DIAGNOSIS — I509 Heart failure, unspecified: Secondary | ICD-10-CM | POA: Diagnosis present

## 2020-04-08 DIAGNOSIS — T502X5A Adverse effect of carbonic-anhydrase inhibitors, benzothiadiazides and other diuretics, initial encounter: Secondary | ICD-10-CM | POA: Diagnosis not present

## 2020-04-08 DIAGNOSIS — E873 Alkalosis: Secondary | ICD-10-CM | POA: Diagnosis present

## 2020-04-08 DIAGNOSIS — I5033 Acute on chronic diastolic (congestive) heart failure: Secondary | ICD-10-CM | POA: Diagnosis present

## 2020-04-08 DIAGNOSIS — H3530 Unspecified macular degeneration: Secondary | ICD-10-CM | POA: Diagnosis present

## 2020-04-08 DIAGNOSIS — D62 Acute posthemorrhagic anemia: Secondary | ICD-10-CM | POA: Diagnosis not present

## 2020-04-08 DIAGNOSIS — H5462 Unqualified visual loss, left eye, normal vision right eye: Secondary | ICD-10-CM | POA: Diagnosis present

## 2020-04-08 DIAGNOSIS — Z6835 Body mass index (BMI) 35.0-35.9, adult: Secondary | ICD-10-CM

## 2020-04-08 DIAGNOSIS — J441 Chronic obstructive pulmonary disease with (acute) exacerbation: Secondary | ICD-10-CM | POA: Diagnosis present

## 2020-04-08 DIAGNOSIS — I110 Hypertensive heart disease with heart failure: Principal | ICD-10-CM | POA: Diagnosis present

## 2020-04-08 DIAGNOSIS — F17210 Nicotine dependence, cigarettes, uncomplicated: Secondary | ICD-10-CM | POA: Diagnosis present

## 2020-04-08 DIAGNOSIS — E1169 Type 2 diabetes mellitus with other specified complication: Secondary | ICD-10-CM | POA: Diagnosis present

## 2020-04-08 DIAGNOSIS — Z79899 Other long term (current) drug therapy: Secondary | ICD-10-CM

## 2020-04-08 DIAGNOSIS — I071 Rheumatic tricuspid insufficiency: Secondary | ICD-10-CM | POA: Diagnosis present

## 2020-04-08 DIAGNOSIS — R601 Generalized edema: Secondary | ICD-10-CM | POA: Diagnosis not present

## 2020-04-08 DIAGNOSIS — G4700 Insomnia, unspecified: Secondary | ICD-10-CM | POA: Diagnosis present

## 2020-04-08 DIAGNOSIS — Z888 Allergy status to other drugs, medicaments and biological substances status: Secondary | ICD-10-CM

## 2020-04-08 DIAGNOSIS — E875 Hyperkalemia: Secondary | ICD-10-CM | POA: Diagnosis present

## 2020-04-08 LAB — CBC WITH DIFFERENTIAL/PLATELET
Abs Immature Granulocytes: 0.01 K/uL (ref 0.00–0.07)
Basophils Absolute: 0 K/uL (ref 0.0–0.1)
Basophils Relative: 0 %
Eosinophils Absolute: 0 K/uL (ref 0.0–0.5)
Eosinophils Relative: 0 %
HCT: 42.6 % (ref 39.0–52.0)
Hemoglobin: 13.6 g/dL (ref 13.0–17.0)
Immature Granulocytes: 0 %
Lymphocytes Relative: 9 %
Lymphs Abs: 0.5 K/uL — ABNORMAL LOW (ref 0.7–4.0)
MCH: 28.8 pg (ref 26.0–34.0)
MCHC: 31.9 g/dL (ref 30.0–36.0)
MCV: 90.3 fL (ref 80.0–100.0)
Monocytes Absolute: 0.6 K/uL (ref 0.1–1.0)
Monocytes Relative: 10 %
Neutro Abs: 4.6 K/uL (ref 1.7–7.7)
Neutrophils Relative %: 81 %
Platelets: 183 K/uL (ref 150–400)
RBC: 4.72 MIL/uL (ref 4.22–5.81)
RDW: 15 % (ref 11.5–15.5)
WBC: 5.7 K/uL (ref 4.0–10.5)
nRBC: 0 % (ref 0.0–0.2)

## 2020-04-08 LAB — BLOOD GAS, VENOUS
Acid-Base Excess: 5.8 mmol/L — ABNORMAL HIGH (ref 0.0–2.0)
Bicarbonate: 32.8 mmol/L — ABNORMAL HIGH (ref 20.0–28.0)
O2 Saturation: 62.6 %
Patient temperature: 37
pCO2, Ven: 58 mmHg (ref 44.0–60.0)
pH, Ven: 7.36 (ref 7.250–7.430)
pO2, Ven: 34 mmHg (ref 32.0–45.0)

## 2020-04-08 LAB — COMPREHENSIVE METABOLIC PANEL
ALT: 11 U/L (ref 0–44)
AST: 23 U/L (ref 15–41)
Albumin: 3.6 g/dL (ref 3.5–5.0)
Alkaline Phosphatase: 123 U/L (ref 38–126)
Anion gap: 12 (ref 5–15)
BUN: 15 mg/dL (ref 8–23)
CO2: 29 mmol/L (ref 22–32)
Calcium: 9.6 mg/dL (ref 8.9–10.3)
Chloride: 95 mmol/L — ABNORMAL LOW (ref 98–111)
Creatinine, Ser: 1.08 mg/dL (ref 0.61–1.24)
GFR, Estimated: >60 mL/min (ref >60)
Glucose, Bld: 108 mg/dL — ABNORMAL HIGH (ref 70–99)
Potassium: 3.3 mmol/L — ABNORMAL LOW (ref 3.5–5.1)
Sodium: 136 mmol/L (ref 135–145)
Total Bilirubin: 3.8 mg/dL — ABNORMAL HIGH (ref 0.3–1.2)
Total Protein: 6.5 g/dL (ref 6.5–8.1)

## 2020-04-08 LAB — TYPE AND SCREEN
ABO/RH(D): B POS
Antibody Screen: NEGATIVE

## 2020-04-08 LAB — TROPONIN I (HIGH SENSITIVITY)
Troponin I (High Sensitivity): 18 ng/L — ABNORMAL HIGH (ref <18)
Troponin I (High Sensitivity): 21 ng/L — ABNORMAL HIGH (ref <18)

## 2020-04-08 LAB — BRAIN NATRIURETIC PEPTIDE: B Natriuretic Peptide: 116.3 pg/mL — ABNORMAL HIGH (ref 0.0–100.0)

## 2020-04-08 MED ORDER — POTASSIUM CHLORIDE CRYS ER 20 MEQ PO TBCR
40.0000 meq | EXTENDED_RELEASE_TABLET | Freq: Once | ORAL | Status: AC
  Administered 2020-04-09: 40 meq via ORAL
  Filled 2020-04-08: qty 2

## 2020-04-08 MED ORDER — TIMOLOL MALEATE 0.5 % OP SOLN
1.0000 [drp] | Freq: Two times a day (BID) | OPHTHALMIC | Status: DC
  Administered 2020-04-09 – 2020-04-22 (×28): 1 [drp] via OPHTHALMIC
  Filled 2020-04-08: qty 5

## 2020-04-08 MED ORDER — FUROSEMIDE 8 MG/ML PO SOLN: 60.0000 mg | ORAL | Status: DC

## 2020-04-08 MED ORDER — SODIUM CHLORIDE 0.9% FLUSH: 3.0000 mL | INTRAVENOUS | Status: DC | PRN

## 2020-04-08 MED ORDER — POTASSIUM CHLORIDE 20 MEQ PO PACK
40.0000 meq | PACK | ORAL | Status: AC
  Administered 2020-04-08: 40 meq via ORAL
  Filled 2020-04-08: qty 2

## 2020-04-08 MED ORDER — ENOXAPARIN SODIUM 60 MG/0.6ML ~~LOC~~ SOLN
0.5000 mg/kg | SUBCUTANEOUS | Status: DC
  Administered 2020-04-09 – 2020-04-14 (×6): 52.5 mg via SUBCUTANEOUS
  Filled 2020-04-08 (×6): qty 0.6

## 2020-04-08 MED ORDER — ENOXAPARIN SODIUM 40 MG/0.4ML ~~LOC~~ SOLN: 40.0000 mg | SUBCUTANEOUS | Status: DC

## 2020-04-08 MED ORDER — METOPROLOL SUCCINATE ER 25 MG PO TB24
25.0000 mg | ORAL_TABLET | ORAL | Status: DC
  Administered 2020-04-09: 25 mg via ORAL
  Filled 2020-04-08: qty 1

## 2020-04-08 MED ORDER — SODIUM CHLORIDE 0.9 % IV SOLN
250.0000 mL | INTRAVENOUS | Status: DC | PRN
  Administered 2020-04-16: 250 mL via INTRAVENOUS

## 2020-04-08 MED ORDER — SERTRALINE HCL 50 MG PO TABS
25.0000 mg | ORAL_TABLET | Freq: Every day | ORAL | Status: DC
  Administered 2020-04-09 – 2020-04-22 (×12): 25 mg via ORAL
  Filled 2020-04-08 (×13): qty 1

## 2020-04-08 MED ORDER — MOMETASONE FURO-FORMOTEROL FUM 200-5 MCG/ACT IN AERO
2.0000 | INHALATION_SPRAY | Freq: Two times a day (BID) | RESPIRATORY_TRACT | Status: DC
Start: 2020-04-08 — End: 2020-04-08

## 2020-04-08 MED ORDER — ONDANSETRON HCL 4 MG/2ML IJ SOLN: 4.0000 mg | Freq: Four times a day (QID) | INTRAMUSCULAR | Status: DC | PRN

## 2020-04-08 MED ORDER — FLUTICASONE FUROATE-VILANTEROL 200-25 MCG/INH IN AEPB
1.0000 | INHALATION_SPRAY | Freq: Every day | RESPIRATORY_TRACT | Status: DC
  Administered 2020-04-09 – 2020-04-22 (×14): 1 via RESPIRATORY_TRACT
  Filled 2020-04-08 (×2): qty 28

## 2020-04-08 MED ORDER — BRIMONIDINE TARTRATE 0.15 % OP SOLN
1.0000 [drp] | Freq: Three times a day (TID) | OPHTHALMIC | Status: DC
  Administered 2020-04-09 – 2020-04-22 (×38): 1 [drp] via OPHTHALMIC
  Filled 2020-04-08: qty 5

## 2020-04-08 MED ORDER — ALBUTEROL SULFATE HFA 108 (90 BASE) MCG/ACT IN AERS
2.0000 | INHALATION_SPRAY | Freq: Four times a day (QID) | RESPIRATORY_TRACT | Status: DC | PRN
  Administered 2020-04-09 – 2020-04-18 (×9): 2 via RESPIRATORY_TRACT
  Filled 2020-04-08 (×3): qty 6.7

## 2020-04-08 MED ORDER — ATORVASTATIN CALCIUM 20 MG PO TABS
20.0000 mg | ORAL_TABLET | ORAL | Status: DC
  Administered 2020-04-09 – 2020-04-16 (×9): 20 mg via ORAL
  Filled 2020-04-08 (×8): qty 1

## 2020-04-08 MED ORDER — FUROSEMIDE 10 MG/ML IJ SOLN
40.0000 mg | Freq: Two times a day (BID) | INTRAMUSCULAR | Status: DC
  Administered 2020-04-09 – 2020-04-10 (×4): 40 mg via INTRAVENOUS
  Filled 2020-04-08 (×4): qty 4

## 2020-04-08 MED ORDER — TIOTROPIUM BROMIDE MONOHYDRATE 18 MCG IN CAPS
18.0000 ug | ORAL_CAPSULE | Freq: Every day | RESPIRATORY_TRACT | Status: DC
  Administered 2020-04-09 – 2020-04-20 (×12): 18 ug via RESPIRATORY_TRACT
  Filled 2020-04-08 (×2): qty 5

## 2020-04-08 MED ORDER — SODIUM CHLORIDE 0.9% FLUSH
3.0000 mL | Freq: Two times a day (BID) | INTRAVENOUS | Status: DC
  Administered 2020-04-09 – 2020-04-22 (×27): 3 mL via INTRAVENOUS

## 2020-04-08 MED ORDER — FUROSEMIDE 10 MG/ML IJ SOLN
60.0000 mg | Freq: Once | INTRAMUSCULAR | Status: AC
  Administered 2020-04-08: 60 mg via INTRAVENOUS
  Filled 2020-04-08: qty 8

## 2020-04-08 MED ORDER — CIPROFLOXACIN HCL 500 MG PO TABS
500.0000 mg | ORAL_TABLET | Freq: Two times a day (BID) | ORAL | Status: DC
Start: 2020-04-08 — End: 2020-04-09
  Administered 2020-04-09 (×2): 500 mg via ORAL
  Filled 2020-04-08 (×2): qty 1

## 2020-04-08 MED ORDER — ACETAMINOPHEN 325 MG PO TABS
650.0000 mg | ORAL_TABLET | ORAL | Status: DC | PRN
  Administered 2020-04-09 – 2020-04-14 (×2): 650 mg via ORAL
  Filled 2020-04-08 (×2): qty 2

## 2020-04-08 MED ORDER — ASPIRIN EC 81 MG PO TBEC
81.0000 mg | DELAYED_RELEASE_TABLET | Freq: Every day | ORAL | Status: DC
  Administered 2020-04-09 – 2020-04-12 (×4): 81 mg via ORAL
  Filled 2020-04-08 (×4): qty 1

## 2020-04-08 MED ORDER — LATANOPROST 0.005 % OP SOLN
1.0000 [drp] | Freq: Every day | OPHTHALMIC | Status: DC
  Administered 2020-04-09 – 2020-04-21 (×14): 1 [drp] via OPHTHALMIC
  Filled 2020-04-08: qty 2.5

## 2020-04-08 NOTE — Progress Notes (Signed)
PHARMACIST - PHYSICIAN COMMUNICATION  CONCERNING:  Enoxaparin (Lovenox) for DVT Prophylaxis    RECOMMENDATION: Patient was prescribed enoxaprin 40mg  q24 hours for VTE prophylaxis.   Filed Weights   04/08/20 1446  Weight: 107 kg (235 lb 14.3 oz)    Body mass index is 35.87 kg/m.  Estimated Creatinine Clearance: 60.3 mL/min (by C-G formula based on SCr of 1.08 mg/dL).   Based on [facility] policy patient is candidate for enoxaparin 0.5mg /kg TBW SQ every 24 hours based on BMI being >30.   DESCRIPTION: Pharmacy has adjusted enoxaparin dose per [facility] policy.  Patient is now receiving enoxaparin 0.5 mg/kg every 24 hours   [facility], PharmD, [facility] 04/08/2020 9:58 PM

## 2020-04-08 NOTE — ED Notes (Signed)
EMS called out for "fluid retention". Bilateral lower extremity weeping edema. PT from home, hx of CHF.  PT is blind

## 2020-04-08 NOTE — ED Notes (Signed)
This RN, Sam RN, and Writer cleaned pt after incontinence episode. Clean chux, linens, and brief also placed at this time. Pt scooted up in bed for comfort and warm blankets given at this time.

## 2020-04-08 NOTE — H&P (Signed)
History and Physical    Corey Wong:[id number] DOB: 05-19-1934 DOA: 04/08/2020  PCP: Marina Goodell, MD (Confirm with patient/family/NH records and if not entered, this has to be entered at [facility] point of entry) Patient coming from: Home  I have personally briefly reviewed patient's old medical records in [facility]  Chief Complaint: SOB, leg swelling  HPI: Corey Wong is a 86 y.o. male with medical history significant of chronic diastolic CHF, paroxysmal A. fib with history of refusing systemic anticoagulation, COPD, cor pulmonale, HTN, IDDM, morbid obesity, macular degeneration, left-sided blindness, sleep apnea diagnosed in 2014 refuses CPAP, presented with increasing short of breath and leg swelling.  Symptoms started about 1 week ago gradually getting worse, last 2 days unable to stand on his feet and had to sleep on the couch.  Denies any cough no chest pain.  No fever chills.  Patient claims he has been compliant with his diuresis regimen and he told me that his weight was about 234 pound 2 weeks ago and today was 235 pounds in the ED.  His PCP has started him on trazodone for insomnia this week, he tried 2 times with reducing dose but could not tolerate it, but he has had this morning by accident he took 1 pill of trazodone and was feeling very sleepy throughout the day. ED Course: Patient lethargic, ABG showed no significant CO2 retention.  X-ray showed fluid retention, physical exam showed bilateral pitting edema and anasarca. Review of Systems: As per HPI otherwise 14 point review of systems negative.    Past Medical History:  Diagnosis Date  . Blind left eye   . Hypertension   . Macular degeneration of right eye     Past Surgical History:  Procedure Laterality Date  . APPENDECTOMY    . ARM DEBRIDEMENT Left   . ARTHROTOMY    . COLONOSCOPY    . ELBOW FLEXORPLASTY    . ESOPHAGOGASTRODUODENOSCOPY    . EXCISION BONE CYST    . JOINT REPLACEMENT    . MUSCLE  FLAP MYOCUTANEOUS / FASCIOCUTANEOUS OF TRUNK    . PROSTATE SURGERY    . TONSILECTOMY, ADENOIDECTOMY, BILATERAL MYRINGOTOMY AND TUBES       reports that he has been smoking cigarettes. He has never used smokeless tobacco. He reports that he does not drink alcohol and does not use drugs.  Allergies  Allergen Reactions  . Linezolid Other (See Comments)    Sore on the inside of mouth and lips   . Sulfa Antibiotics Other (See Comments) and Rash    Other Reaction: hyperactivity    History reviewed. No pertinent family history.   Prior to Admission medications   Medication Sig Start Date End Date Taking? Authorizing Provider  acetaminophen (TYLENOL) 325 MG tablet Take by mouth. Patient not taking: Reported on 11/12/2019 08/18/19   [provider]  albuterol (VENTOLIN HFA) 108 (90 Base) MCG/ACT inhaler Inhale 2 puffs into the lungs 4 (four) times daily as needed.    [provider]  albuterol (VENTOLIN HFA) 108 (90 Base) MCG/ACT inhaler Inhale into the lungs. 11/03/19   [provider]  aspirin 81 MG EC tablet Take by mouth.    [provider]  atorvastatin (LIPITOR) 20 MG tablet Take 20 mg by mouth 1 day or 1 dose. 06/25/18   [provider]  brimonidine (ALPHAGAN P) 0.1 % SOLN Apply to eye.    [provider]  ciprofloxacin (CIPRO) 500 MG tablet Take 500  mg by mouth 2 (two) times daily. 10/16/19   [provider]  clindamycin (CLEOCIN) 150 MG capsule Take 150 mg by mouth 3 (three) times daily. 09/19/19   [provider]  fluticasone-salmeterol (ADVAIR HFA) 789-38 MCG/ACT inhaler Inhale into the lungs.    [provider]  furosemide (LASIX) 10 MG/ML solution Take by mouth.    [provider]  furosemide (LASIX) 20 MG tablet Take 40 mg by mouth 1 day or 1 dose. 06/27/18   [provider]  LUMIGAN 0.01 % SOLN  09/23/19   [provider]  metoprolol succinate (TOPROL-XL) 100 MG 24 hr tablet Take  by mouth.    [provider]  metoprolol succinate (TOPROL-XL) 25 MG 24 hr tablet Take 25 mg by mouth 1 day or 1 dose. 06/25/18   [provider]  MIRAPEX ER 0.375 MG TB24 Take by mouth. 08/18/19   [provider]  polyethylene glycol powder (GLYCOLAX/MIRALAX) 17 GM/SCOOP powder Take by mouth. 07/22/19   [provider]  sertraline (ZOLOFT) 25 MG tablet Take 25 mg by mouth daily. 09/29/19   [provider]  Soft Lens Products (RA SALINE SOLUTION) SOLN  08/18/19   [provider]  timolol (TIMOPTIC) 0.5 % ophthalmic solution  09/21/19   [provider]  Monte Fantasia INHUB 250-50 MCG/DOSE AEPB Inhale 1 puff into the lungs 2 (two) times daily. 09/15/19   [provider]    Physical Exam: Vitals:   04/08/20 1745 04/08/20 1800 04/08/20 1830 04/08/20 1900  BP: 139/60 (!) 107/53 (!) 114/101 (!) 108/48  Pulse: 97 75 89 90  Resp: 20 18 16 17   Temp:      TempSrc:      SpO2: 91% 92% 91% 92%  Weight:      Height:        Constitutional: NAD, calm, comfortable Vitals:   04/08/20 1745 04/08/20 1800 04/08/20 1830 04/08/20 1900  BP: 139/60 (!) 107/53 (!) 114/101 (!) 108/48  Pulse: 97 75 89 90  Resp: 20 18 16 17   Temp:      TempSrc:      SpO2: 91% 92% 91% 92%  Weight:      Height:       Eyes: PERRL, lids and conjunctivae normal ENMT: Mucous membranes are moist. Posterior pharynx clear of any exudate or lesions.Normal dentition.  Neck: normal, supple, no masses, no thyromegaly Respiratory: clear to auscultation bilaterally, no wheezing, fine crackles on bilateral bases.  Increasing respiratory effort. No accessory muscle use.  Cardiovascular: Regular rate and rhythm, no murmurs / rubs / gallops.  2+ extremity edema, with chronic lymphedema-like changes. 2+ pedal pulses. No carotid bruits.  Abdomen: no tenderness, no masses palpated. No hepatosplenomegaly. Bowel sounds positive.  Musculoskeletal: no clubbing / cyanosis. No joint  deformity upper and lower extremities. Good ROM, no contractures. Normal muscle tone.  Skin: no rashes, lesions, ulcers. No induration Neurologic: CN 2-12 grossly intact. Sensation intact, DTR normal. Strength 5/5 in all 4.  Psychiatric: Normal judgment and insight. Alert and oriented x 3. Normal mood.     Labs on Admission: I have personally reviewed following labs and imaging studies  CBC: Recent Labs  Lab 04/08/20 1458  WBC 5.7  NEUTROABS 4.6  HGB 13.6  HCT 42.6  MCV 90.3  PLT 183   Basic Metabolic Panel: Recent Labs  Lab 04/08/20 1458  NA 136  K 3.3*  CL 95*  CO2 29  GLUCOSE 108*  BUN 15  CREATININE 1.08  CALCIUM 9.6   GFR: Estimated Creatinine Clearance: 60.3 mL/min (by C-G formula based on SCr of 1.08 mg/dL). Liver Function Tests: Recent Labs  Lab 04/08/20 1458  AST 23  ALT 11  ALKPHOS 123  BILITOT 3.8*  PROT 6.5  ALBUMIN 3.6   No results for input(s): LIPASE, AMYLASE in the last 168 hours. No results for input(s): AMMONIA in the last 168 hours. Coagulation Profile: No results for input(s): INR, PROTIME in the last 168 hours. Cardiac Enzymes: No results for input(s): CKTOTAL, CKMB, CKMBINDEX, TROPONINI in the last 168 hours. BNP (last 3 results) No results for input(s): PROBNP in the last 8760 hours. HbA1C: No results for input(s): HGBA1C in the last 72 hours. CBG: No results for input(s): GLUCAP in the last 168 hours. Lipid Profile: No results for input(s): CHOL, HDL, LDLCALC, TRIG, CHOLHDL, LDLDIRECT in the last 72 hours. Thyroid Function Tests: No results for input(s): TSH, T4TOTAL, FREET4, T3FREE, THYROIDAB in the last 72 hours. Anemia Panel: No results for input(s): VITAMINB12, FOLATE, FERRITIN, TIBC, IRON, RETICCTPCT in the last 72 hours. Urine analysis: No results found for: COLORURINE, APPEARANCEUR, LABSPEC, PHURINE, GLUCOSEU, HGBUR, BILIRUBINUR, KETONESUR, PROTEINUR, UROBILINOGEN, NITRITE, LEUKOCYTESUR  Radiological Exams on  Admission: DG Chest 1 View  Result Date: 04/08/2020 CLINICAL DATA:  CHF, shortness of breath. EXAM: CHEST  1 VIEW COMPARISON:  None. FINDINGS: Mild enlargement the cardiac silhouette. Central vascular congestion. Aortic atherosclerosis. Suspected small bilateral pleural effusions. No visible pneumothorax. Retrocardiac opacities. IMPRESSION: 1. Mild cardiomegaly, central pulmonary vascular congestion, and suspected small bilateral pleural effusions. 2. Retrocardiac opacities, which could represent atelectasis, aspiration, and/or pneumonia. Electronically Signed   By: Margaretha Sheffield MD   On: 04/08/2020 15:43    EKG: Independently reviewed.  A. fib with right bundle  Assessment/Plan Active Problems:   Acute CHF (congestive heart failure) (HCC)   CHF (congestive heart failure) ([place])  (please populate well all problems here in Problem List. (For example, if patient is on BP meds at home and you resume or decide to hold them, it is a problem that needs to be her. Same for CAD, COPD, HLD and so on)  Acute on chronic diastolic CHF decompensation -Has signs of fluid overload, received 60 mg IV Lasix in the ED, will continue 40 mg IV Lasix twice daily. -Echocardiogram. -Consider consult patient's cardiologist at [place]  Cor pulmonale -Echocardiogram last year at [facility] showed severe dilated right atrium and tricuspid regurgitation indicating impaired right-sided function, although RV systolic function reported as mild reduced.  Further suspect is related to prolonged undertreatment of sleep apnea which was diagnosed at least 7 to 8 years ago but patient has been refusing CPAP. -Educated patient at bedside regarding use of CPAP can also help his insomnia, patient agreed to follow-up sleep study clinic.  Chronic A. Fib -Rate controlled -History of refusing systemic anticoagulation.  COPD -No symptoms or signs of acute exacerbation -Continue current breathing meds regimen.  Morbid obesity -Calorie  control  DVT prophylaxis: Lovenox Code Status: Full code Family Communication: None at bedside Disposition Plan: Expect more than 2 midnight hospital stay, also add PT evaluation and it appears patient may need SNF  consults called: None Admission status: Telemetry admission   Lequita Halt MD [facility] Pager [phone]  04/08/2020, 8:49 PM

## 2020-04-08 NOTE — ED Provider Notes (Signed)
[facility] Emergency Department Provider Note   ____________________________________________   Event Date/Time   First MD Initiated Contact with Patient 04/08/20 1850     (approximate)  I have reviewed the triage vital signs and the nursing notes.   HISTORY  Chief Complaint Edema    HPI Corey Wong is a 86 y.o. male history of paroxysmal A. fib, coronary disease, CHF, diabetes  Patient presents today with family due to concerns for confusion as well as significantly increased swelling in his lower legs for the last several days  His family is with him reports that this morning he accidentally took a sleeping pill instead of his normal medication because of poor eyesight and he slept most morning, but they are concerned because he still remains somewhat sleepy and his legs become very very swollen.  They report including the patient reports that last time this happened he had to stay at [facility] and had to have a lot of fluid taken out  He denies shortness of breath.  No fevers or chills.  No nausea or vomiting.  Does report increased swelling and weakness and fatigue swelling involving both lower legs which is slowly worsening.  He also had a fall and banged his right shoulder slightly yesterday.  Denies head injury.  The shoulder is not causing him any trouble today though.  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 Blind left eye   . Hypertension   . Macular degeneration of right eye     Patient Active Problem List   Diagnosis Date Noted  . Bladder outlet obstruction 10/21/2019  . Glaucoma (increased eye pressure) 10/21/2019  . Lower extremity edema 10/21/2019  . Macular degeneration 10/21/2019  . Morbid obesity (HCC) 10/21/2019  . Tobacco dependence 10/21/2019  . Swelling of limb 10/21/2019  . Follow-up examination after orthopedic surgery 09/02/2019  . Acute blood loss anemia 07/11/2019  .  Chronic kidney disease (CKD), stage III (moderate) (HCC) 07/11/2019  . Heart failure, diastolic, acute on chronic (HCC) 07/11/2019  . RBBB 07/11/2019  . Paroxysmal atrial fibrillation (HCC) 10/23/2017  . Benign prostatic hyperplasia with nocturia 06/26/2017  . Essential hypertension 06/26/2017  . Mild chronic obstructive pulmonary disease (HCC) 06/26/2017  . Mixed hyperlipidemia 06/26/2017  . Obstructive sleep apnea syndrome 06/26/2017  . Prediabetes 06/26/2017  . CAD (coronary artery disease) 12/18/2013    Past Surgical History:  Procedure Laterality Date  . APPENDECTOMY    . ARM DEBRIDEMENT Left   . ARTHROTOMY    . COLONOSCOPY    . ELBOW FLEXORPLASTY    . ESOPHAGOGASTRODUODENOSCOPY    . EXCISION BONE CYST    . JOINT REPLACEMENT    . MUSCLE FLAP MYOCUTANEOUS / FASCIOCUTANEOUS OF TRUNK    . PROSTATE SURGERY    . TONSILECTOMY, ADENOIDECTOMY, BILATERAL MYRINGOTOMY AND TUBES      Prior to Admission medications   Medication Sig Start Date End Date Taking? Authorizing Provider  acetaminophen (TYLENOL) 325 MG tablet Take by mouth. Patient not taking: Reported on 11/12/2019 08/18/19   [provider]  albuterol (VENTOLIN HFA) 108 (90 Base) MCG/ACT inhaler Inhale 2 puffs into the lungs 4 (four) times daily as needed.    [provider]  albuterol (VENTOLIN HFA) 108 (90 Base) MCG/ACT inhaler Inhale into the lungs. 11/03/19   [provider]  aspirin 81 MG EC tablet Take by mouth.    [provider]  atorvastatin (LIPITOR) 20  MG tablet Take 20 mg by mouth 1 day or 1 dose. 06/25/18   [provider]  brimonidine (ALPHAGAN P) 0.1 % SOLN Apply to eye.    [provider]  ciprofloxacin (CIPRO) 500 MG tablet Take 500 mg by mouth 2 (two) times daily. 10/16/19   [provider]  clindamycin (CLEOCIN) 150 MG capsule Take 150 mg by mouth 3 (three) times daily. 09/19/19   [provider]  fluticasone-salmeterol (ADVAIR HFA) 858-85  MCG/ACT inhaler Inhale into the lungs.    [provider]  furosemide (LASIX) 10 MG/ML solution Take by mouth.    [provider]  furosemide (LASIX) 20 MG tablet Take 40 mg by mouth 1 day or 1 dose. 06/27/18   [provider]  LUMIGAN 0.01 % SOLN  09/23/19   [provider]  metoprolol succinate (TOPROL-XL) 100 MG 24 hr tablet Take by mouth.    [provider]  metoprolol succinate (TOPROL-XL) 25 MG 24 hr tablet Take 25 mg by mouth 1 day or 1 dose. 06/25/18   [provider]  MIRAPEX ER 0.375 MG TB24 Take by mouth. 08/18/19   [provider]  polyethylene glycol powder (GLYCOLAX/MIRALAX) 17 GM/SCOOP powder Take by mouth. 07/22/19   [provider]  sertraline (ZOLOFT) 25 MG tablet Take 25 mg by mouth daily. 09/29/19   [provider]  [employer] (RA SALINE SOLUTION) SOLN  08/18/19   [provider]  timolol (TIMOPTIC) 0.5 % ophthalmic solution  09/21/19   [provider]  Grant Ruts INHUB 250-50 MCG/DOSE AEPB Inhale 1 puff into the lungs 2 (two) times daily. 09/15/19   [provider]    Allergies Linezolid and Sulfa antibiotics  History reviewed. No pertinent family history.  Social History Social History   Tobacco Use  . Smoking status: Current Every Day Smoker    Types: Cigarettes  . Smokeless tobacco: Never Used  Substance Use Topics  . Alcohol use: Never  . Drug use: Never    Review of Systems Constitutional: No fever/chills Eyes: No visual changes. ENT: No sore throat. Cardiovascular: Denies chest pain. Respiratory: Denies shortness of breath. Gastrointestinal: No abdominal pain.   Genitourinary: Negative for dysuria. Musculoskeletal: Negative for back pain.  Increased swelling both legs Skin: Negative for rash.  Reports the skin across his legs always seems a little bit reddish and he has severe swelling involving both legs for a long time Neurological: Negative for  headaches, areas of focal weakness or numbness.    ____________________________________________   PHYSICAL EXAM:  VITAL SIGNS: ED Triage Vitals  Enc Vitals Group     BP 04/08/20 1444 126/62     Pulse Rate 04/08/20 1444 73     Resp 04/08/20 1444 (!) 24     Temp 04/08/20 1444 (!) 97.5 F (36.4 C)     Temp Source 04/08/20 1444 Oral     SpO2 04/08/20 1444 95 %     Weight 04/08/20 1446 235 lb 14.3 oz (107 kg)     Height 04/08/20 1446 5\' 8"  (1.727 m)     Head Circumference --      Peak Flow --      Pain Score 04/08/20 1754 4     Pain Loc --      Pain Edu? --      Excl. in [place]? --     Constitutional: Alert and oriented but somnolent, drifts off to sleep when not spoken to rather abruptly or quickly.  He arouses easily to voice and participates in conversation.  Chronically ill, appears fatigued.  Generally weak Eyes: Conjunctivae are normal. Head: Atraumatic. Nose: No congestion/rhinnorhea. Mouth/Throat: Mucous membranes are moist. Neck: No stridor.  Cardiovascular: Normal rate, irregular rhythm. Grossly normal heart sounds.  Good peripheral circulation. Respiratory: Normal respiratory effort.  Diminished lung sounds in the bases bilateral.  No wheezing or rales. Gastrointestinal: Soft and nontender. No distention.  Morbidly obese Musculoskeletal: Anasarca from just below the bellybutton all the way to the feet bilaterally with pitting edema.  Also chronic basis venous stasis changes and what appears to be evidence of elephantiasis bilateral. Neurologic:  Normal speech and language. No gross focal neurologic deficits are appreciated.  Skin:  Skin is warm, dry and intact. No rash noted. Psychiatric: Mood and affect are normal to calm, slightly somnolent. Speech and behavior are normal.  ____________________________________________   LABS (all labs ordered are listed, but only abnormal results are displayed)  Labs Reviewed  CBC WITH DIFFERENTIAL/PLATELET - Abnormal; Notable for  the following components:      Result Value   Lymphs Abs 0.5 (*)    All other components within normal limits  COMPREHENSIVE METABOLIC PANEL - Abnormal; Notable for the following components:   Potassium 3.3 (*)    Chloride 95 (*)    Glucose, Bld 108 (*)    Total Bilirubin 3.8 (*)    All other components within normal limits  BRAIN NATRIURETIC PEPTIDE - Abnormal; Notable for the following components:   B Natriuretic Peptide 116.3 (*)    All other components within normal limits  BLOOD GAS, VENOUS - Abnormal; Notable for the following components:   Bicarbonate 32.8 (*)    Acid-Base Excess 5.8 (*)    All other components within normal limits  TROPONIN I (HIGH SENSITIVITY) - Abnormal; Notable for the following components:   Troponin I (High Sensitivity) 18 (*)    All other components within normal limits  TROPONIN I (HIGH SENSITIVITY) - Abnormal; Notable for the following components:   Troponin I (High Sensitivity) 21 (*)    All other components within normal limits  SARS CORONAVIRUS 2 (TAT 6-24 HRS)  TYPE AND SCREEN   ____________________________________________  EKG  Reviewed interpreted at 1445 Heart rate 90 QRS 130 QTc 520 Atrial fibrillation right bundle branch block.  No evidence of acute ischemia. ____________________________________________  RADIOLOGY  DG Chest 1 View  Result Date: 04/08/2020 CLINICAL DATA:  CHF, shortness of breath. EXAM: CHEST  1 VIEW COMPARISON:  None. FINDINGS: Mild enlargement the cardiac silhouette. Central vascular congestion. Aortic atherosclerosis. Suspected small bilateral pleural effusions. No visible pneumothorax. Retrocardiac opacities. IMPRESSION: 1. Mild cardiomegaly, central pulmonary vascular congestion, and suspected small bilateral pleural effusions. 2. Retrocardiac opacities, which could represent atelectasis, aspiration, and/or pneumonia. Electronically Signed   By: Feliberto Harts MD   On: 04/08/2020 15:43    Imaging reviewed,  cardiomegaly vascular congestion small bilateral pleural effusions.  Also noted retrocardiac opacity etiology unclear but radiologist reports possible atelectasis aspiration or pneumonia ____________________________________________   PROCEDURES  Procedure(s) performed: None  Procedures  Critical Care performed: No  ____________________________________________   INITIAL IMPRESSION / ASSESSMENT AND PLAN / ED COURSE  Pertinent labs & imaging results that were available during my care of the patient were reviewed by me and considered in my medical decision making (see chart for details).   Patient presents with somnolence and evidence of severe edema anasarca.  Suspect this is likely due to his underlying heart failure and CHF.  His mental status has improved but I suspect that there is likely element of multifactorial etiology contributing to his altered mental status.  I suspect this is primarily being driven by his congestive heart failure possibly but due to the excellently taking trazodone this morning.  He is extremely volume overloaded, and given his imaging studies and his clinical history I think he would benefit from IV diuresis and admission to the hospital for further management and work-up.  He does not appear to be safe to go home given the gravity of his condition and somnolence associated.  Will send VBG as well to evaluate for hypercapnia  Admission discussed with hospitalist  I do not see evidence of acute infection.  Denies head injury or acute neurologic symptoms.  Normal white count afebrile no infectious symptoms noted.        ____________________________________________   FINAL CLINICAL IMPRESSION(S) / ED DIAGNOSES  Final diagnoses:  Altered mental status, unspecified altered mental status type  Acute on chronic congestive heart failure, unspecified heart failure type [facility])        Note:  This document was prepared using Dragon voice recognition software  and may include unintentional dictation errors       Sharyn Creamer, MD 04/08/20 2012

## 2020-04-08 NOTE — ED Triage Notes (Signed)
Pt called out for for BLE edema. Pt also c/o R shoulder pain, pt states he thinks he fell on it the other night. Pt noted to be lethargic in triage at this time but arousable. Intermittently pt c/o pain, other times pt denies pain.   Pt with noted poor coloring in triage, dry mucous membranes noted in triage.

## 2020-04-09 ENCOUNTER — Inpatient Hospital Stay
Admit: 2020-04-09 | Discharge: 2020-04-09 | Disposition: A | Discharge status: Home / Self Care | Payer: Medicare Other | Attending: Internal Medicine | Admitting: Internal Medicine

## 2020-04-09 ENCOUNTER — Encounter: Payer: Self-pay | Admitting: Internal Medicine

## 2020-04-09 DIAGNOSIS — J449 Chronic obstructive pulmonary disease, unspecified: Secondary | ICD-10-CM | POA: Diagnosis not present

## 2020-04-09 DIAGNOSIS — G4700 Insomnia, unspecified: Secondary | ICD-10-CM

## 2020-04-09 DIAGNOSIS — I482 Chronic atrial fibrillation, unspecified: Secondary | ICD-10-CM | POA: Diagnosis not present

## 2020-04-09 DIAGNOSIS — F32A Depression, unspecified: Secondary | ICD-10-CM

## 2020-04-09 DIAGNOSIS — F419 Anxiety disorder, unspecified: Secondary | ICD-10-CM

## 2020-04-09 DIAGNOSIS — I509 Heart failure, unspecified: Secondary | ICD-10-CM | POA: Diagnosis not present

## 2020-04-09 DIAGNOSIS — E669 Obesity, unspecified: Secondary | ICD-10-CM

## 2020-04-09 DIAGNOSIS — I5031 Acute diastolic (congestive) heart failure: Secondary | ICD-10-CM

## 2020-04-09 LAB — BASIC METABOLIC PANEL
Anion gap: 7 (ref 5–15)
BUN: 15 mg/dL (ref 8–23)
CO2: 31 mmol/L (ref 22–32)
Calcium: 8.9 mg/dL (ref 8.9–10.3)
Chloride: 100 mmol/L (ref 98–111)
Creatinine, Ser: 0.99 mg/dL (ref 0.61–1.24)
GFR, Estimated: >60 mL/min (ref >60)
Glucose, Bld: 94 mg/dL (ref 70–99)
Potassium: 3.8 mmol/L (ref 3.5–5.1)
Sodium: 138 mmol/L (ref 135–145)

## 2020-04-09 LAB — URINALYSIS, COMPLETE (UACMP) WITH MICROSCOPIC
Bacteria, UA: NONE SEEN
Bilirubin Urine: NEGATIVE
Glucose, UA: NEGATIVE mg/dL
Hgb urine dipstick: NEGATIVE
Ketones, ur: NEGATIVE mg/dL
Leukocytes,Ua: NEGATIVE
Nitrite: NEGATIVE
Protein, ur: NEGATIVE mg/dL
Specific Gravity, Urine: 1.006 (ref 1.005–1.030)
Squamous Epithelial / HPF: NONE SEEN (ref 0–5)
pH: 7 (ref 5.0–8.0)

## 2020-04-09 LAB — ECHOCARDIOGRAM COMPLETE
AR max vel: 1.95 cm2
AV Area VTI: 2.15 cm2
AV Area mean vel: 1.85 cm2
AV Mean grad: 6 mmHg
AV Peak grad: 11.6 mmHg
Ao pk vel: 1.7 m/s
Area-P 1/2: 4.89 cm2
Height: 68 in
S' Lateral: 3.3 cm
Weight: 3774.28 oz

## 2020-04-09 LAB — SARS CORONAVIRUS 2 (TAT 6-24 HRS): SARS Coronavirus 2: NEGATIVE

## 2020-04-09 LAB — PROCALCITONIN: Procalcitonin: <0.1 ng/mL

## 2020-04-09 LAB — MAGNESIUM: Magnesium: 2.3 mg/dL (ref 1.7–2.4)

## 2020-04-09 MED ORDER — NICOTINE 21 MG/24HR TD PT24
21.0000 mg | MEDICATED_PATCH | Freq: Every day | TRANSDERMAL | Status: DC
  Administered 2020-04-09 – 2020-04-22 (×15): 21 mg via TRANSDERMAL
  Filled 2020-04-09 (×15): qty 1

## 2020-04-09 MED ORDER — METOPROLOL SUCCINATE ER 25 MG PO TB24
12.5000 mg | ORAL_TABLET | Freq: Every day | ORAL | Status: DC
Start: 2020-04-09 — End: 2020-04-21
  Administered 2020-04-11 – 2020-04-20 (×10): 12.5 mg via ORAL
  Filled 2020-04-09 (×12): qty 1

## 2020-04-09 MED ORDER — PERFLUTREN LIPID MICROSPHERE
1.0000 mL | INTRAVENOUS | Status: AC | PRN
  Administered 2020-04-09: 2 mL via INTRAVENOUS
  Filled 2020-04-09: qty 10

## 2020-04-09 MED ORDER — TEMAZEPAM 7.5 MG PO CAPS
7.5000 mg | ORAL_CAPSULE | Freq: Every evening | ORAL | Status: DC | PRN
  Filled 2020-04-09: qty 1

## 2020-04-09 MED ORDER — POTASSIUM CHLORIDE CRYS ER 20 MEQ PO TBCR
20.0000 meq | EXTENDED_RELEASE_TABLET | Freq: Two times a day (BID) | ORAL | Status: DC
  Administered 2020-04-09 – 2020-04-12 (×8): 20 meq via ORAL
  Filled 2020-04-09 (×8): qty 1

## 2020-04-09 NOTE — Progress Notes (Signed)
Patient ID: Corey Wong, male   DOB: [birth year], 86 y.o.   MRN: [id number] [facility] PROGRESS NOTE  Corey Wong:[id number] DOB: 02/14/1935 DOA: 04/08/2020 PCP: Marina Goodell, MD  HPI/Subjective: Patient stated He tried a sleeping pill that made him crazy.  He stated he may have taken during the day also.  He has been gaining fluid weight and shortness of breath.    Objective: Vitals:   04/09/20 0643 04/09/20 0840  BP: 115/61 (!) 97/48  Pulse: 90 73  Resp:  20  Temp: 97.9 F (36.6 C) (!) 97.3 F (36.3 C)  SpO2: 96% 100%    Intake/Output Summary (Last 24 hours) at 04/09/2020 1318 Last data filed at 04/09/2020 0625 Gross per 24 hour  Intake --  Output 1800 ml  Net -1800 ml   Filed Weights   04/08/20 1446  Weight: 107 kg    ROS: Review of Systems  Respiratory: Positive for shortness of breath.   Cardiovascular: Negative for chest pain.  Gastrointestinal: Negative for abdominal pain, nausea and vomiting.   Exam: Physical Exam HENT:     Head: Normocephalic.     Mouth/Throat:     Pharynx: No oropharyngeal exudate.  Eyes:     General: Lids are normal.     Conjunctiva/sclera: Conjunctivae normal.     Pupils: Pupils are equal, round, and reactive to light.  Cardiovascular:     Rate and Rhythm: Normal rate and regular rhythm.     Heart sounds: S1 normal and S2 normal. Murmur heard.   Systolic murmur is present with a grade of 2/6.   Pulmonary:     Breath sounds: Examination of the right-lower field reveals decreased breath sounds and rales. Examination of the left-lower field reveals decreased breath sounds and rales. Decreased breath sounds and rales present. No wheezing or rhonchi.  Abdominal:     Palpations: Abdomen is soft.     Tenderness: There is no abdominal tenderness.  Musculoskeletal:     Right upper arm: Swelling present.     Left upper arm: Swelling present.  Skin:    General: Skin is warm.     Comments: Chronic lower extremity  discolaration  Neurological:     Mental Status: He is alert and oriented to person, place, and time.       Data Reviewed: Basic Metabolic Panel: Recent Labs  Lab 04/08/20 1458 04/09/20 0723  NA 136 138  K 3.3* 3.8  CL 95* 100  CO2 29 31  GLUCOSE 108* 94  BUN 15 15  CREATININE 1.08 0.99  CALCIUM 9.6 8.9  MG  --  2.3   Liver Function Tests: Recent Labs  Lab 04/08/20 1458  AST 23  ALT 11  ALKPHOS 123  BILITOT 3.8*  PROT 6.5  ALBUMIN 3.6   No results for input(s): LIPASE, AMYLASE in the last 168 hours. No results for input(s): AMMONIA in the last 168 hours. CBC: Recent Labs  Lab 04/08/20 1458  WBC 5.7  NEUTROABS 4.6  HGB 13.6  HCT 42.6  MCV 90.3  PLT 183   BNP (last 3 results) Recent Labs    04/08/20 1458  BNP 116.3*      Recent Results (from the past 240 hour(s))  SARS CORONAVIRUS 2 (TAT 6-24 HRS) Nasopharyngeal Nasopharyngeal Swab     Status: None   Collection Time: 04/08/20  7:28 PM   Specimen: Nasopharyngeal Swab  Result Value Ref Range Status   SARS Coronavirus 2 NEGATIVE NEGATIVE Final  Comment: (NOTE) SARS-CoV-2 target nucleic acids are NOT DETECTED.  The SARS-CoV-2 RNA is generally detectable in upper and lower respiratory specimens during the acute phase of infection. Negative results do not preclude SARS-CoV-2 infection, do not rule out co-infections with other pathogens, and should not be used as the sole basis for treatment or other patient management decisions. Negative results must be combined with clinical observations, patient history, and epidemiological information. The expected result is Negative.  Fact Sheet for Patients: HairSlick.no  Fact Sheet for Healthcare Providers: quierodirigir.com  This test is not yet approved or cleared by the [place] FDA and  has been authorized for detection and/or diagnosis of SARS-CoV-2 by FDA under an Emergency Use  Authorization (EUA). This EUA will remain  in effect (meaning this test can be used) for the duration of the COVID-19 declaration under Se ction 564(b)(1) of the Act, 21 U.S.C. section 360bbb-3(b)(1), unless the authorization is terminated or revoked sooner.  Performed at [facility], [address], [place], [place] [address]      Studies: DG Chest 1 View  Result Date: 04/08/2020 CLINICAL DATA:  CHF, shortness of breath. EXAM: CHEST  1 VIEW COMPARISON:  None. FINDINGS: Mild enlargement the cardiac silhouette. Central vascular congestion. Aortic atherosclerosis. Suspected small bilateral pleural effusions. No visible pneumothorax. Retrocardiac opacities. IMPRESSION: 1. Mild cardiomegaly, central pulmonary vascular congestion, and suspected small bilateral pleural effusions. 2. Retrocardiac opacities, which could represent atelectasis, aspiration, and/or pneumonia. Electronically Signed   By: Feliberto Harts MD   On: 04/08/2020 15:43    Scheduled Meds: . aspirin EC  81 mg Oral Daily  . atorvastatin  20 mg Oral 1 day or 1 dose  . brimonidine  1 drop Both Eyes Q8H  . enoxaparin (LOVENOX) injection  0.5 mg/kg Subcutaneous Q24H  . fluticasone furoate-vilanterol  1 puff Inhalation Daily  . furosemide  40 mg Intravenous BID  . latanoprost  1 drop Both Eyes QHS  . metoprolol succinate  12.5 mg Oral QHS  . nicotine  21 mg Transdermal Daily  . potassium chloride  20 mEq Oral BID  . sertraline  25 mg Oral Daily  . sodium chloride flush  3 mL Intravenous Q12H  . timolol  1 drop Both Eyes BID  . tiotropium  18 mcg Inhalation Daily   Continuous Infusions: . sodium chloride      Assessment/Plan:  1. Acute congestive heart failure with anasarca.  Echocardiogram still pending.  Continue Lasix 40 mg IV twice a day.  Daily weights.  Decrease dose of Toprol-XL down to 12.5 mg.  Blood pressure limiting other medications at this point. 2. Insomnia.  May have taken a trazodone medication  during the day.  He did not do well with the trazodone even when he took it at night. 3. Chronic atrial fibrillation on low-dose metoprolol.  Not on anticoagulation. 4. COPD continue inhalers 5. Obesity with a BMI of 35.87.  Continue to monitor with diuresis 6. Anxiety depression continue Zoloft 7. Tobacco abuse on nicotine patch 8. Hyperlipidemia unspecified on atorvastatin 9. Weakness.  Physical therapy evaluation    Code Status:     Code Status Orders  (From admission, onward)         Start     Ordered   04/08/20 2048  Full code  Continuous        04/08/20 2048        Code Status History    This patient has a current code status but no  historical code status.   Advance Care Planning Activity     Family Communication: Spoke with the patient's sister-in-law on the phone Disposition Plan: Status is: Inpatient  Dispo: The patient is from: Home              Anticipated d/c is to: Rehab              Anticipated d/c date is: Likely a few days with diuresis here in the hospital              Patient currently requiring IV Lasix for diuresis.  Time spent: 28 minutes  [place]

## 2020-04-09 NOTE — Plan of Care (Signed)

## 2020-04-09 NOTE — Evaluation (Signed)
Physical Therapy Evaluation Patient Details Name: Corey Wong MRN: [id number] DOB: 07-05-1934 Today's Date: 04/09/2020   History of Present Illness  Pt is a 86 y.o. male with medical history significant of chronic diastolic CHF, paroxysmal A. fib with history of refusing systemic anticoagulation, COPD, cor pulmonale, HTN, IDDM, morbid obesity, macular degeneration, left-sided blindness, sleep apnea diagnosed in 2014 refuses CPAP, presented with increasing short of breath and leg swelling.    Clinical Impression  Pt agreeable to PT with moderate motivation. Did have garbled speech with some difficulty to understand but appeared oriented to place, situation, alert throughout. Pt with unclear PLOF; did state in the last week he was have a lot of trouble getting up and moving due to swelling, referenced using his wheelchair.  The patient was maxAx2 for supine <> sit. He was able to initiate movement by reaching for bed rail and moving LEs on the bed but still utimately maxAx2. Once in sitting, progressed to sitting EOB with totalA to re adjust, poor sitting balance noted. Pt preferred bilateral UE propping with feet supported. Time spent on RUE ROM at pt request, active range very limited due to pt complaints of pain. He was also able to perform LAQ with supervision. Returned to supine with all needs in reach.  Overall the patient demonstrated deficits (see "PT Problem List") that impede the patient's functional abilities, safety, and mobility and would benefit from skilled PT intervention. Recommendation is SNF due to acute decline in functional mobility and current level of assistance needed.     Follow Up Recommendations SNF    Equipment Recommendations  Other (comment) (TBD at next venue of care)    Recommendations for Other Services       Precautions / Restrictions Precautions Precautions: Fall Restrictions Weight Bearing Restrictions: No      Mobility  Bed Mobility Overal bed  mobility: Needs Assistance Bed Mobility: Supine to Sit;Sit to Supine Rolling: Max assist;+2 for physical assistance   Supine to sit: Max assist;+2 for safety/equipment Sit to supine: +2 for safety/equipment   General bed mobility comments: pt able to reach for grab rail, move LEs in preparation for transfer    Transfers                 General transfer comment: deferred due to safety concerns/pt fatigue and pain  Ambulation/Gait                Stairs            Wheelchair Mobility    Modified Rankin (Stroke Patients Only)       Balance Overall balance assessment: Needs assistance Sitting-balance support: Feet supported;Bilateral upper extremity supported Sitting balance-Leahy Scale: Poor                                       Pertinent Vitals/Pain Pain Assessment: Faces Faces Pain Scale: Hurts whole lot Pain Location: BLE with ROM and RUE Pain Descriptors / Indicators: Dull;Discomfort Pain Intervention(s): Limited activity within patient's tolerance;Repositioned    Home Living Family/patient expects to be discharged to:: Private residence Living Arrangements: Children             Home Equipment: Environmental consultant - 2 wheels Additional Comments: Pt is perseverative and does not answer home setup questions or PLOF.    Prior Function Level of Independence: Needs assistance   Gait / Transfers Assistance Needed: pt reported he has  a RW and a wheelchair and in the last week had significantly more difficulty getting around     Comments: Pt unable to provide PLOF     Hand Dominance        Extremity/Trunk Assessment   Upper Extremity Assessment Upper Extremity Assessment: Defer to OT evaluation LUE Deficits / Details: AROM shoulder flexion limited ~20*    Lower Extremity Assessment Lower Extremity Assessment: Generalized weakness (able to lift minimally against gravity, limited more by pain)    Cervical / Trunk  Assessment Cervical / Trunk Assessment: Kyphotic  Communication   Communication: No difficulties  Cognition Arousal/Alertness: Awake/alert Behavior During Therapy: Agitated Overall Cognitive Status: No family/caregiver present to determine baseline cognitive functioning                                 General Comments: awake, alert, garbled speech but seemed to speak appropriately and on task. Multiple complaints of pain and concern of his swelling      General Comments      Exercises General Exercises - Upper Extremity Shoulder Flexion: AAROM;Strengthening;Both;Supine Shoulder Extension: AAROM;Strengthening;Both;Supine Elbow Flexion: AAROM;Strengthening;Both;Supine Elbow Extension: AAROM;Strengthening;Both;Supine General Exercises - Lower Extremity Quad Sets: AAROM;Strengthening;Both;Supine Short Arc Quad: AAROM;Strengthening;Both;Supine Other Exercises Other Exercises: R shoulder ROM at pt request due to pain; x15 reps AAROM shoulder flexion, abduction, horizontal abduction. Other Exercises: x5 LAW bilaterally in sitting. unable to march due to body habitus and weakness   Assessment/Plan    PT Assessment Patient needs continued PT services  PT Problem List Decreased strength;Decreased mobility;Decreased activity tolerance;Decreased range of motion;Decreased balance;Decreased knowledge of use of DME;Pain       PT Treatment Interventions DME instruction;Therapeutic exercise;Gait training;Balance training;Stair training;Neuromuscular re-education;Functional mobility training;Therapeutic activities;Patient/family education    PT Goals (Current goals can be found in the Care Plan section)  Acute Rehab PT Goals Patient Stated Goal: to go home PT Goal Formulation: With patient Time For Goal Achievement: 04/23/20 Potential to Achieve Goals: Good    Frequency Min 2X/week   Barriers to discharge Decreased caregiver support      Co-evaluation                AM-PAC PT "6 Clicks" Mobility  Outcome Measure Help needed turning from your back to your side while in a flat bed without using bedrails?: Total Help needed moving from lying on your back to sitting on the side of a flat bed without using bedrails?: Total Help needed moving to and from a bed to a chair (including a wheelchair)?: Total Help needed standing up from a chair using your arms (e.g., wheelchair or bedside chair)?: Total Help needed to walk in hospital room?: Total Help needed climbing 3-5 steps with a railing? : Total 6 Click Score: 6    End of Session   Activity Tolerance: Patient limited by fatigue;Patient limited by pain Patient left: in bed;with call bell/phone within reach;with bed alarm set Nurse Communication: Mobility status PT Visit Diagnosis: Other abnormalities of gait and mobility (R26.89);Muscle weakness (generalized) (M62.81);Difficulty in walking, not elsewhere classified (R26.2);Pain Pain - Right/Left: Right Pain - part of body: Shoulder    Time: 1327-1350 PT Time Calculation (min) (ACUTE ONLY): 23 min   Charges:   PT Evaluation $PT Eval Moderate Complexity: 1 Mod PT Treatments $Therapeutic Exercise: 8-22 mins       Olga Coaster PT, DPT 3:21 PM,04/09/20

## 2020-04-09 NOTE — Progress Notes (Signed)
   Heart Failure Nurse Navigator Note.  Attempted to see Corey Wong twice this afternoon, each time he was sound asleep.  Tresa Endo RN, [facility]

## 2020-04-09 NOTE — Progress Notes (Signed)
*  PRELIMINARY RESULTS* Echocardiogram 2D Echocardiogram has been performed.  Corey Wong Corey Wong 04/09/2020, 10:32 AM

## 2020-04-09 NOTE — Evaluation (Addendum)
Occupational Therapy Evaluation Patient Details Name: Corey Wong MRN: [id number] DOB: 11-Jul-1934 Today's Date: 04/09/2020    History of Present Illness Corey Wong is a 86 y.o. male with medical history significant of chronic diastolic CHF, paroxysmal A. fib with history of refusing systemic anticoagulation, COPD, cor pulmonale, HTN, IDDM, morbid obesity, macular degeneration, left-sided blindness, sleep apnea diagnosed in 2014 refuses CPAP, presented with increasing short of breath and leg swelling.   Clinical Impression   Corey Wong was seen for OT evaluation this date. Pt is difficult to rouse, somnolent t/o session. When awoken pt is perseverative on his sleeping pills and is difficult to redirect to task. Unable to state home setup, states he used RW. Pt presents to acute OT demonstrating impaired ADL performance and functional mobility 2/2 decreased activity tolerance, functional strength/ROM/balance deficits, and poor insight into strengths.   Pt currently requires MAX A x2 rolling at bed level. MAX A don B socks at bed level. SETUP self-drinking at bed level. Pt completed THEREX bed level as described below. Pt would benefit from skilled OT to address noted impairments and functional limitations (see below for any additional details) in order to maximize safety and independence while minimizing falls risk and caregiver burden. Upon hospital discharge, recommend STR to maximize pt safety and return to PLOF.     Follow Up Recommendations  SNF    Equipment Recommendations  Other (comment) (TBD)    Recommendations for Other Services       Precautions / Restrictions Precautions Precautions: Fall Restrictions Weight Bearing Restrictions: No      Mobility Bed Mobility Overal bed mobility: Needs Assistance Bed Mobility: Rolling Rolling: Max assist;+2 for physical assistance              Transfers                 General transfer comment: Pt refused all OOB  mobility attempts        ADL either performed or assessed with clinical judgement   ADL Overall ADL's : Needs assistance/impaired                                       General ADL Comments: MAX A x2 rolling at bed level. MAX A don B socks at bed level. SETUP self-drinking at bed level                  Pertinent Vitals/Pain Pain Assessment: Faces Faces Pain Scale: Hurts little more Pain Location: BLE with ROM Pain Descriptors / Indicators: Dull;Discomfort Pain Intervention(s): Limited activity within patient's tolerance;Repositioned     Hand Dominance     Extremity/Trunk Assessment Upper Extremity Assessment Upper Extremity Assessment: Generalized weakness;LUE deficits/detail LUE Deficits / Details: AROM shoulder flexion limited ~20*   Lower Extremity Assessment Lower Extremity Assessment: Generalized weakness       Communication Communication Communication: No difficulties   Cognition Arousal/Alertness: Lethargic Behavior During Therapy: Agitated Overall Cognitive Status: No family/caregiver present to determine baseline cognitive functioning                                 General Comments: Difficult to rouse, somnolent t/o session. When awoken pt is perseverative on certain topics and difficult to redirect to task. Unable to state home setup   General Comments       Exercises  Exercises: Other exercises;General Upper Extremity;General Lower Extremity General Exercises - Upper Extremity Shoulder Flexion: AAROM;Strengthening;Both;Supine Shoulder Extension: AAROM;Strengthening;Both;Supine Elbow Flexion: AAROM;Strengthening;Both;Supine Elbow Extension: AAROM;Strengthening;Both;Supine General Exercises - Lower Extremity Quad Sets: AAROM;Strengthening;Both;Supine Short Arc Quad: AAROM;Strengthening;Both;Supine Other Exercises Other Exercises: Pt educated re: OT role, DME recs, d/c recs, importance of mobility for functional  strengthening Other Exercises: LBD, rolling   Shoulder Instructions      Home Living Family/patient expects to be discharged to:: Private residence                             Home Equipment: Walker - 2 wheels   Additional Comments: Pt is perseverative and does not answer home setup questions or PLOF.      Prior Functioning/Environment Level of Independence: Needs assistance        Comments: Pt unable to provide PLOF        OT Problem List: Decreased strength;Decreased range of motion;Decreased activity tolerance;Impaired balance (sitting and/or standing);Decreased safety awareness      OT Treatment/Interventions: Self-care/ADL training;Therapeutic exercise;Energy conservation;DME and/or AE instruction;Therapeutic activities;Patient/family education;Balance training    OT Goals(Current goals can be found in the care plan section) Acute Rehab OT Goals Patient Stated Goal: To go home OT Goal Formulation: With patient Time For Goal Achievement: 04/23/20 Potential to Achieve Goals: Good ADL Goals Pt Will Perform Grooming: with set-up;with min assist;sitting Pt Will Perform Lower Body Dressing: with mod assist;sitting/lateral leans Pt Will Transfer to Toilet: with min assist (rolling at bed level)  OT Frequency: Min 1X/week   Barriers to D/C: Inaccessible home environment;Decreased caregiver support             AM-PAC OT "6 Clicks" Daily Activity     Outcome Measure Help from another person eating meals?: A Little Help from another person taking care of personal grooming?: A Little Help from another person toileting, which includes using toliet, bedpan, or urinal?: A Lot Help from another person bathing (including washing, rinsing, drying)?: A Lot Help from another person to put on and taking off regular upper body clothing?: A Little Help from another person to put on and taking off regular lower body clothing?: A Lot 6 Click Score: 15   End of Session  Equipment Utilized During Treatment: Oxygen (1.5 [place])  Activity Tolerance: Patient limited by fatigue Patient left: in bed;with call bell/phone within reach;with bed alarm set  OT Visit Diagnosis: Other abnormalities of gait and mobility (R26.89)                Time: 1610-9604 OT Time Calculation (min): 12 min Charges:  OT General Charges $OT Visit: 1 Visit OT Evaluation $OT Eval Low Complexity: 1 Low OT Treatments $Therapeutic Exercise: 8-22 mins  Kathie Dike, M.S. OTR/L  04/09/20, 1:58 PM  ascom [phone]

## 2020-04-09 NOTE — Progress Notes (Signed)
CSW acknowledges TOC consult. Will follow up after PT/OT evals based on their recommendations.  Alfonso Ramus, [place] [phone]

## 2020-04-10 DIAGNOSIS — D6869 Other thrombophilia: Secondary | ICD-10-CM

## 2020-04-10 DIAGNOSIS — R601 Generalized edema: Secondary | ICD-10-CM

## 2020-04-10 DIAGNOSIS — F419 Anxiety disorder, unspecified: Secondary | ICD-10-CM | POA: Diagnosis not present

## 2020-04-10 DIAGNOSIS — I482 Chronic atrial fibrillation, unspecified: Secondary | ICD-10-CM | POA: Diagnosis not present

## 2020-04-10 DIAGNOSIS — I5031 Acute diastolic (congestive) heart failure: Secondary | ICD-10-CM | POA: Diagnosis not present

## 2020-04-10 LAB — PROCALCITONIN: Procalcitonin: <0.1 ng/mL

## 2020-04-10 LAB — BASIC METABOLIC PANEL
Anion gap: 9 (ref 5–15)
BUN: 20 mg/dL (ref 8–23)
CO2: 31 mmol/L (ref 22–32)
Calcium: 9.1 mg/dL (ref 8.9–10.3)
Chloride: 99 mmol/L (ref 98–111)
Creatinine, Ser: 1.18 mg/dL (ref 0.61–1.24)
GFR, Estimated: >60 mL/min (ref >60)
Glucose, Bld: 98 mg/dL (ref 70–99)
Potassium: 4 mmol/L (ref 3.5–5.1)
Sodium: 139 mmol/L (ref 135–145)

## 2020-04-10 NOTE — Plan of Care (Signed)
Pt alert and oriented, on 2L [place], afebrile. BP on the soft side with pt denying any symptoms. PO fluids offered, BP medication held. Provider notified. Pt reported pain and PRN tylenol was given with fair relief. Pt took pills one at a time with water. Call bell within reach. Aspiration and falls precautions remained in place. Problem: Education: Goal: Knowledge of General Education information will improve Description: Including pain rating scale, medication(s)/side effects and non-pharmacologic comfort measures Outcome: Progressing   Problem: Clinical Measurements: Goal: Will remain free from infection Outcome: Progressing   Problem: Coping: Goal: Level of anxiety will decrease Outcome: Progressing   Problem: Pain Managment: Goal: General experience of comfort will improve Outcome: Progressing   Problem: Safety: Goal: Ability to remain free from injury will improve Outcome: Progressing

## 2020-04-10 NOTE — Progress Notes (Signed)
Patient ID: Corey Wong, male   DOB: Aug 11, 1934, 86 y.o.   MRN: [id number] [facility] PROGRESS NOTE  Corey Wong WUJ:[id number] DOB: [birth year] DOA: 04/08/2020 PCP: Marina Goodell, MD  HPI/Subjective: Patient states he also has some right shoulder pain and unable to lift his shoulder up.  He had previous fall.  He feels his breathing is about the same as when he came in.  Still has swelling in the legs.  Admitted with CHF exacerbation and anasarca.  Objective: Vitals:   04/10/20 0509 04/10/20 0902  BP: (!) 92/42 (!) 101/57  Pulse: 81 70  Resp: 18 17  Temp: 98.1 F (36.7 C) 98.3 F (36.8 C)  SpO2: 96% 98%    Intake/Output Summary (Last 24 hours) at 04/10/2020 1423 Last data filed at 04/10/2020 1101 Gross per 24 hour  Intake 240 ml  Output 950 ml  Net -710 ml   Filed Weights   04/08/20 1446  Weight: 107 kg    ROS: Review of Systems  Respiratory: Negative for shortness of breath.   Cardiovascular: Negative for chest pain.  Gastrointestinal: Negative for abdominal pain, nausea and vomiting.   Exam: Physical Exam HENT:     Head: Normocephalic.     Mouth/Throat:     Pharynx: No oropharyngeal exudate.  Eyes:     General: Lids are normal.     Conjunctiva/sclera: Conjunctivae normal.     Pupils: Pupils are equal, round, and reactive to light.  Cardiovascular:     Rate and Rhythm: Normal rate and regular rhythm.     Heart sounds: Normal heart sounds, S1 normal and S2 normal.  Pulmonary:     Breath sounds: Examination of the right-lower field reveals decreased breath sounds. Examination of the left-lower field reveals decreased breath sounds. Decreased breath sounds present. No wheezing, rhonchi or rales.  Abdominal:     Palpations: Abdomen is soft.     Tenderness: There is no abdominal tenderness.  Musculoskeletal:     Right lower leg: Swelling present.     Left lower leg: Swelling present.     Right ankle: Swelling present.     Left ankle: Swelling present.   Skin:    General: Skin is warm.     Comments: Chronic lower extremity skin discoloration.  Daughter-in-law states that his leg redness is actually less than previous  Neurological:     Mental Status: He is alert and oriented to person, place, and time.       Data Reviewed: Basic Metabolic Panel: Recent Labs  Lab 04/08/20 1458 04/09/20 0723 04/10/20 0536  NA 136 138 139  K 3.3* 3.8 4.0  CL 95* 100 99  CO2 29 31 31   GLUCOSE 108* 94 98  BUN 15 15 20   CREATININE 1.08 0.99 1.18  CALCIUM 9.6 8.9 9.1  MG  --  2.3  --    Liver Function Tests: Recent Labs  Lab 04/08/20 1458  AST 23  ALT 11  ALKPHOS 123  BILITOT 3.8*  PROT 6.5  ALBUMIN 3.6   CBC: Recent Labs  Lab 04/08/20 1458  WBC 5.7  NEUTROABS 4.6  HGB 13.6  HCT 42.6  MCV 90.3  PLT 183   BNP (last 3 results) Recent Labs    04/08/20 1458  BNP 116.3*      Recent Results (from the past 240 hour(s))  SARS CORONAVIRUS 2 (TAT 6-24 HRS) Nasopharyngeal Nasopharyngeal Swab     Status: None   Collection Time: 04/08/20  7:28 PM  Specimen: Nasopharyngeal Swab  Result Value Ref Range Status   SARS Coronavirus 2 NEGATIVE NEGATIVE Final    Comment: (NOTE) SARS-CoV-2 target nucleic acids are NOT DETECTED.  The SARS-CoV-2 RNA is generally detectable in upper and lower respiratory specimens during the acute phase of infection. Negative results do not preclude SARS-CoV-2 infection, do not rule out co-infections with other pathogens, and should not be used as the sole basis for treatment or other patient management decisions. Negative results must be combined with clinical observations, patient history, and epidemiological information. The expected result is Negative.  Fact Sheet for Patients: HairSlick.no  Fact Sheet for Healthcare Providers: quierodirigir.com  This test is not yet approved or cleared by the [place] FDA and  has been authorized for  detection and/or diagnosis of SARS-CoV-2 by FDA under an Emergency Use Authorization (EUA). This EUA will remain  in effect (meaning this test can be used) for the duration of the COVID-19 declaration under Se ction 564(b)(1) of the Act, 21 U.S.C. section 360bbb-3(b)(1), unless the authorization is terminated or revoked sooner.  Performed at [facility], [address], [place], [place] [address]      Studies: DG Chest 1 View  Result Date: 04/08/2020 CLINICAL DATA:  CHF, shortness of breath. EXAM: CHEST  1 VIEW COMPARISON:  None. FINDINGS: Mild enlargement the cardiac silhouette. Central vascular congestion. Aortic atherosclerosis. Suspected small bilateral pleural effusions. No visible pneumothorax. Retrocardiac opacities. IMPRESSION: 1. Mild cardiomegaly, central pulmonary vascular congestion, and suspected small bilateral pleural effusions. 2. Retrocardiac opacities, which could represent atelectasis, aspiration, and/or pneumonia. Electronically Signed   By: Feliberto Harts MD   On: 04/08/2020 15:43   ECHOCARDIOGRAM COMPLETE  Result Date: 04/09/2020    ECHOCARDIOGRAM REPORT   Patient Name:   Corey Wong Date of Exam: 04/09/2020 Medical Rec #:  [id number]      Height:       68.0 in Accession #:    [id number]     Weight:       235.9 lb Date of Birth:  1935-03-03       BSA:          2.192 m Patient Age:    86 years       BP:           97/48 mmHg Patient Gender: M              HR:           101 bpm. Exam Location:  [facility] Procedure: 2D Echo, Color Doppler, Cardiac Doppler and Intracardiac            Opacification Agent Indications:     I50.31 CHF-Acute Diastolic  History:         Patient has no prior history of Echocardiogram examinations.                  Risk Factors:Hypertension.  Sonographer:     Humphrey Rolls RDCS (AE) Referring Phys:  [id number] Alford Highland Diagnosing Phys: Marcina Millard MD  Sonographer Comments: Technically difficult study due to poor echo windows. Image acquisition  challenging due to patient body habitus. IMPRESSIONS  1. Left ventricular ejection fraction, by estimation, is 50 to 55%. The left ventricle has low normal function. The left ventricle has no regional wall motion abnormalities. Left ventricular diastolic parameters are indeterminate.  2. Right ventricular systolic function is normal. The right ventricular size is normal.  3. The mitral valve is normal in structure. Mild mitral valve  regurgitation. No evidence of mitral stenosis.  4. The aortic valve is normal in structure. Aortic valve regurgitation is not visualized. No aortic stenosis is present.  5. The inferior vena cava is normal in size with greater than 50% respiratory variability, suggesting right atrial pressure of 3 mmHg. FINDINGS  Left Ventricle: Left ventricular ejection fraction, by estimation, is 50 to 55%. The left ventricle has low normal function. The left ventricle has no regional wall motion abnormalities. Definity contrast agent was given IV to delineate the left ventricular endocardial borders. The left ventricular internal cavity size was normal in size. There is no left ventricular hypertrophy. Left ventricular diastolic parameters are indeterminate. Right Ventricle: The right ventricular size is normal. No increase in right ventricular wall thickness. Right ventricular systolic function is normal. Left Atrium: Left atrial size was normal in size. Right Atrium: Right atrial size was normal in size. Pericardium: There is no evidence of pericardial effusion. Mitral Valve: The mitral valve is normal in structure. Mild mitral valve regurgitation. No evidence of mitral valve stenosis. MV peak gradient, 5.2 mmHg. The mean mitral valve gradient is 3.0 mmHg. Tricuspid Valve: The tricuspid valve is normal in structure. Tricuspid valve regurgitation is mild . No evidence of tricuspid stenosis. Aortic Valve: The aortic valve is normal in structure. Aortic valve regurgitation is not visualized. No aortic  stenosis is present. Aortic valve mean gradient measures 6.0 mmHg. Aortic valve peak gradient measures 11.6 mmHg. Aortic valve area, by VTI measures 2.15 cm. Pulmonic Valve: The pulmonic valve was normal in structure. Pulmonic valve regurgitation is not visualized. No evidence of pulmonic stenosis. Aorta: The aortic root is normal in size and structure. Venous: The inferior vena cava is normal in size with greater than 50% respiratory variability, suggesting right atrial pressure of 3 mmHg. IAS/Shunts: No atrial level shunt detected by color flow Doppler.  LEFT VENTRICLE PLAX 2D LVIDd:         4.50 cm LVIDs:         3.30 cm LV PW:         0.90 cm LV IVS:        0.90 cm LVOT diam:     1.90 cm LV SV:         56 LV SV Index:   26 LVOT Area:     2.84 cm  LEFT ATRIUM         Index LA diam:    5.50 cm 2.51 cm/m  AORTIC VALVE AV Area (Vmax):    1.95 cm AV Area (Vmean):   1.85 cm AV Area (VTI):     2.15 cm AV Vmax:           170.00 cm/s AV Vmean:          116.000 cm/s AV VTI:            0.261 m AV Peak Grad:      11.6 mmHg AV Mean Grad:      6.0 mmHg LVOT Vmax:         117.00 cm/s LVOT Vmean:        75.600 cm/s LVOT VTI:          0.198 m LVOT/AV VTI ratio: 0.76  AORTA Ao Root diam: 3.50 cm MITRAL VALVE                TRICUSPID VALVE MV Area (PHT): 4.89 cm     TR Peak grad:   46.8 mmHg MV Peak grad:  5.2 mmHg  TR Vmax:        342.00 cm/s MV Mean grad:  3.0 mmHg MV Vmax:       1.14 m/s     SHUNTS MV Vmean:      78.3 cm/s    Systemic VTI:  0.20 m MV Decel Time: 155 msec     Systemic Diam: 1.90 cm MV E velocity: 109.33 cm/s Marcina MillardAlexander Paraschos MD Electronically signed by Marcina MillardAlexander Paraschos MD Signature Date/Time: 04/09/2020/4:07:42 PM    Final     Scheduled Meds: . aspirin EC  81 mg Oral Daily  . atorvastatin  20 mg Oral 1 day or 1 dose  . brimonidine  1 drop Both Eyes Q8H  . enoxaparin (LOVENOX) injection  0.5 mg/kg Subcutaneous Q24H  . fluticasone furoate-vilanterol  1 puff Inhalation Daily  . furosemide  40  mg Intravenous BID  . latanoprost  1 drop Both Eyes QHS  . metoprolol succinate  12.5 mg Oral QHS  . nicotine  21 mg Transdermal Daily  . potassium chloride  20 mEq Oral BID  . sertraline  25 mg Oral Daily  . sodium chloride flush  3 mL Intravenous Q12H  . timolol  1 drop Both Eyes BID  . tiotropium  18 mcg Inhalation Daily   Continuous Infusions: . sodium chloride      Assessment/Plan:  1. Acute diastolic congestive heart failure with anasarca.  Continue Lasix 40 mg IV twice daily.  Need daily weights.  Low-dose Toprol-XL.  Other medications limited secondary to relative hypotension. 2. Insomnia.  Had a reaction to trazodone at home and mistakenly also took during the day.  As needed Restoril at night. 3. Chronic atrial fibrillation on low-dose metoprolol.  Not on any anticoagulation.  Only aspirin at this time. 4. Acquired thrombophilia secondary to atrial fibrillation.  Stroke risk higher not being on any anticoagulation. 5. COPD.  Continue inhalers 6. Obesity with a BMI of 35.87.  Need a new weight from today.  Continue to monitor with diuresis. 7. Right shoulder unable to extend up.  We will get occupational therapy evaluation and continue physical therapy evaluations. 8. Anxiety depression continue Zoloft 9. Tobacco abuse on nicotine patch 10. Hyperlipidemia unspecified on atorvastatin 11. Weakness.  Physical therapy recommends rehab. 12. Check pulse ox on room air.  If good will do overnight oximetry on room air to see if qualifies for nocturnal oxygen.        Code Status:     Code Status Orders  (From admission, onward)         Start     Ordered   04/08/20 2048  Full code  Continuous        04/08/20 2048        Code Status History    This patient has a current code status but no historical code status.   Advance Care Planning Activity     Family Communication: Spoke with daughter-in-law at the bedside Disposition Plan: Status is: Inpatient  Dispo: The  patient is from: Home              Anticipated d/c is to: Rehab              Anticipated d/c date is: Transitional care team looking into rehab beds.  Earliest potential would be Monday, 04/12/2020 versus Tuesday, 04/13/2020.              Patient currently being diuresed with IV Lasix.  Continue to monitor weights.  Time spent: 28 minutes  [place]  [facility] [employer]

## 2020-04-10 NOTE — [place] FL2 (Addendum)
[place] MEDICAID FL2 LEVEL OF CARE SCREENING TOOL     IDENTIFICATION  Patient Name: Corey Wong Birthdate: [birth year] Sex: male Admission Date (Current Location): 04/08/2020  [place] and [place] Number:  Chiropodist and Address:  [facility], [address] [place], [place] [address]      Provider Number: [id number]  Attending Physician Name and Address:  Alford Highland, MD  Relative Name and Phone Number:  Britt Petroni [phone]    Current Level of Care: Hospital Recommended Level of Care: Skilled Nursing Facility Prior Approval Number:    Date Approved/Denied:   PASRR Number: [id number]  Discharge Plan:      Current Diagnoses: Patient Active Problem List   Diagnosis Date Noted  . Anasarca   . Acquired thrombophilia (HCC)   . Insomnia   . Chronic a-fib (HCC)   . Obesity (BMI 30-39.9)   . Anxiety and depression   . Acute CHF (congestive heart failure) (HCC) 04/08/2020  . CHF (congestive heart failure) (HCC) 04/08/2020  . Bladder outlet obstruction 10/21/2019  . Glaucoma (increased eye pressure) 10/21/2019  . Lower extremity edema 10/21/2019  . Macular degeneration 10/21/2019  . Morbid obesity (HCC) 10/21/2019  . Tobacco dependence 10/21/2019  . Swelling of limb 10/21/2019  . Follow-up examination after orthopedic surgery 09/02/2019  . Acute blood loss anemia 07/11/2019  . Chronic kidney disease (CKD), stage III (moderate) (HCC) 07/11/2019  . Acute diastolic CHF (congestive heart failure) (HCC) 07/11/2019  . RBBB 07/11/2019  . Paroxysmal atrial fibrillation (HCC) 10/23/2017  . Benign prostatic hyperplasia with nocturia 06/26/2017  . Essential hypertension 06/26/2017  . Chronic obstructive pulmonary disease (HCC) 06/26/2017  . Mixed hyperlipidemia 06/26/2017  . Obstructive sleep apnea syndrome 06/26/2017  . Prediabetes 06/26/2017  . CAD (coronary artery disease) 12/18/2013    Orientation RESPIRATION BLADDER  Height & Weight     Self,Time,Situation,Place  Normal Incontinent Weight:  (Bed scale is broken, pt can not stand to weigh) Height:  5\' 8"  (172.7 cm)  BEHAVIORAL SYMPTOMS/MOOD NEUROLOGICAL BOWEL NUTRITION STATUS      Continent Diet  AMBULATORY STATUS COMMUNICATION OF NEEDS Skin   Extensive Assist Verbally Normal                       Personal Care Assistance Level of Assistance  Bathing,Feeding,Dressing Bathing Assistance: Maximum assistance Feeding assistance: Maximum assistance Dressing Assistance: Maximum assistance     Functional Limitations Info  Sight,Hearing,Speech Sight Info: Impaired Hearing Info: Adequate Speech Info: Adequate    SPECIAL CARE FACTORS FREQUENCY  PT (By licensed PT),OT (By licensed OT)     PT Frequency: 5x per week OT Frequency: 5x per week            Contractures Contractures Info: Not present    Additional Factors Info  Code Status Code Status Info: full code             Current Medications (04/10/2020):  This is the current hospital active medication list Current Facility-Administered Medications  Medication Dose Route Frequency Provider Last Rate Last Admin  . 0.9 %  sodium chloride infusion  250 mL Intravenous PRN 06/08/2020 T, MD      . acetaminophen (TYLENOL) tablet 650 mg  650 mg Oral Q4H PRN Mikey College, MD   650 mg at 04/09/20 2219  . albuterol (VENTOLIN HFA) 108 (90 Base) MCG/ACT inhaler 2 puff  2 puff Inhalation Q6H PRN 2220, MD   2 puff  at 04/09/20 0324  . aspirin EC tablet 81 mg  81 mg Oral Daily Mikey College T, MD   81 mg at 04/10/20 0914  . atorvastatin (LIPITOR) tablet 20 mg  20 mg Oral 1 day or 1 dose Mikey College T, MD   20 mg at 04/09/20 2216  . brimonidine (ALPHAGAN) 0.15 % ophthalmic solution 1 drop  1 drop Both Eyes Q8H Emeline General, MD   1 drop at 04/10/20 0610  . enoxaparin (LOVENOX) injection 52.5 mg  0.5 mg/kg Subcutaneous Q24H Otelia Sergeant, RPH   52.5 mg at 04/09/20 2214  . fluticasone  furoate-vilanterol (BREO ELLIPTA) 200-25 MCG/INH 1 puff  1 puff Inhalation Daily Mikey College T, MD   1 puff at 04/10/20 0915  . furosemide (LASIX) injection 40 mg  40 mg Intravenous BID Mikey College T, MD   40 mg at 04/10/20 0914  . latanoprost (XALATAN) 0.005 % ophthalmic solution 1 drop  1 drop Both Eyes QHS Emeline General, MD   1 drop at 04/09/20 2215  . metoprolol succinate (TOPROL-XL) 24 hr tablet 12.5 mg  12.5 mg Oral QHS Wieting, Richard, MD      . nicotine (NICODERM CQ - dosed in mg/24 hours) patch 21 mg  21 mg Transdermal Daily Jimmye Norman, NP   21 mg at 04/10/20 0916  . ondansetron (ZOFRAN) injection 4 mg  4 mg Intravenous Q6H PRN Mikey College T, MD      . potassium chloride SA (KLOR-CON) CR tablet 20 mEq  20 mEq Oral BID Alford Highland, MD   20 mEq at 04/10/20 0914  . sertraline (ZOLOFT) tablet 25 mg  25 mg Oral Daily Mikey College T, MD   25 mg at 04/09/20 0900  . sodium chloride flush (NS) 0.9 % injection 3 mL  3 mL Intravenous Q12H Mikey College T, MD   3 mL at 04/10/20 0915  . sodium chloride flush (NS) 0.9 % injection 3 mL  3 mL Intravenous PRN Mikey College T, MD      . temazepam (RESTORIL) capsule 7.5 mg  7.5 mg Oral QHS PRN Wieting, Richard, MD      . timolol (TIMOPTIC) 0.5 % ophthalmic solution 1 drop  1 drop Both Eyes BID Mikey College T, MD   1 drop at 04/10/20 0916  . tiotropium (SPIRIVA) inhalation capsule ([facility] use ONLY) 18 mcg  18 mcg Inhalation Daily Emeline General, MD   18 mcg at 04/10/20 1610     Discharge Medications: Please see discharge summary for a list of discharge medications.  Relevant Imaging Results:  Relevant Lab Results:   Additional Information SS# [id number]  Verna Czech [place], [place]

## 2020-04-10 NOTE — Progress Notes (Signed)
RN rechecked blood pressure using larger cuff. Reading basically the same 87/50. Pt in no discomfort. Breathing even and unlabored; alert and describes no changes in mentation. Will inform provider and continue to monitor.

## 2020-04-10 NOTE — TOC Initial Note (Signed)
Transition of Care [facility]) - Initial/Assessment Note    Patient Details  Name: Corey Wong MRN: [id number] Date of Birth: Nov 26, 1934  [facility] of [facility]) CM/SW Contact:    Verna Czech [place], [place] Phone Number: 04/10/2020, 4:42 PM  Clinical Narrative:                 Patient is a 83 year ol male presented with shortness of breath and leg swelling. Patient lives alone in his own home, however reports that his daughter in law and grandchildren are sup portive and able to assist when needed. Patient states that he has a walker at home.  Patient agreeable to SNF with [facility] being his first choice.  SNF process discussed with patient, FL2 to be completed and faxed out for bed offers.  Neyra Pettie, LCSW Transition of Care [phone]   Expected Discharge Plan: Skilled Nursing Facility Barriers to Discharge: No Barriers Identified   Patient Goals and CMS Choice Patient states their goals for this hospitalization and ongoing recovery are:: "to be able to do stuff by myself" "I want to walk with a walker" CMS Medicare.gov Compare Post Acute Care list provided to:: Patient Choice offered to / list presented to : Patient  Expected Discharge Plan and Services Expected Discharge Plan: Skilled Nursing Facility In-house Referral: Clinical Social Work   Post Acute Care Choice: Skilled Nursing Facility Living arrangements for the past 2 months: Single Family Home                                      Prior Living Arrangements/Services Living arrangements for the past 2 months: Single Family Home Lives with:: Self Patient language and need for interpreter reviewed:: Yes Do you feel safe going back to the place where you live?: Yes      Need for Family Participation in Patient Care: Yes (Comment) Care giver support system in place?: No (comment) Current home services: DME (wheelchair and walker at home) Criminal Activity/Legal Involvement Pertinent to Current  Situation/Hospitalization: No - Comment as needed  Activities of Daily Living Home Assistive Devices/Equipment: Wheelchair ADL Screening (condition at time of admission) Patient's cognitive ability adequate to safely complete daily activities?: No Is the patient deaf or have difficulty hearing?: Yes Does the patient have difficulty seeing, even when wearing glasses/contacts?: Yes Does the patient have difficulty concentrating, remembering, or making decisions?: Yes Patient able to express need for assistance with ADLs?: Yes Does the patient have difficulty dressing or bathing?: Yes Independently performs ADLs?: No Communication: Needs assistance Is this a change from baseline?: Pre-admission baseline Dressing (OT): Needs assistance Is this a change from baseline?: Pre-admission baseline Grooming: Needs assistance Is this a change from baseline?: Pre-admission baseline Feeding: Needs assistance Is this a change from baseline?: Pre-admission baseline Bathing: Needs assistance Is this a change from baseline?: Pre-admission baseline Toileting: Needs assistance Is this a change from baseline?: Pre-admission baseline In/Out Bed: Needs assistance Is this a change from baseline?: Pre-admission baseline Walks in Home: Needs assistance Is this a change from baseline?: Pre-admission baseline Does the patient have difficulty walking or climbing stairs?: Yes Weakness of Legs: Both Weakness of Arms/Hands: Both  Permission Sought/Granted      Share Information with NAME: Joe     Permission granted to share info w Relationship: daughter in law     Emotional Assessment Appearance:: Appears older than stated age Attitude/Demeanor/Rapport: Engaged   Orientation: :  Oriented to Self,Oriented to Place,Oriented to  Time,Oriented to Situation Alcohol / Substance Use: Never Used Psych Involvement: No (comment)  Admission diagnosis:  CHF (congestive heart failure) (HCC) [I50.9] Altered mental  status, unspecified altered mental status type [R41.82] Acute on chronic congestive heart failure, unspecified heart failure type [facility]) [I50.9] Patient Active Problem List   Diagnosis Date Noted  . Anasarca   . Acquired thrombophilia (HCC)   . Insomnia   . Chronic a-fib (HCC)   . Obesity (BMI 30-39.9)   . Anxiety and depression   . Acute CHF (congestive heart failure) (HCC) 04/08/2020  . CHF (congestive heart failure) (HCC) 04/08/2020  . Bladder outlet obstruction 10/21/2019  . Glaucoma (increased eye pressure) 10/21/2019  . Lower extremity edema 10/21/2019  . Macular degeneration 10/21/2019  . Morbid obesity (HCC) 10/21/2019  . Tobacco dependence 10/21/2019  . Swelling of limb 10/21/2019  . Follow-up examination after orthopedic surgery 09/02/2019  . Acute blood loss anemia 07/11/2019  . Chronic kidney disease (CKD), stage III (moderate) (HCC) 07/11/2019  . Acute diastolic CHF (congestive heart failure) (HCC) 07/11/2019  . RBBB 07/11/2019  . Paroxysmal atrial fibrillation (HCC) 10/23/2017  . Benign prostatic hyperplasia with nocturia 06/26/2017  . Essential hypertension 06/26/2017  . Chronic obstructive pulmonary disease (HCC) 06/26/2017  . Mixed hyperlipidemia 06/26/2017  . Obstructive sleep apnea syndrome 06/26/2017  . Prediabetes 06/26/2017  . CAD (coronary artery disease) 12/18/2013   PCP:  Marina Goodell, MD Pharmacy:   [facility] [phone] - [place], [place] - [address] AT [facility] OF [address] [employer] [place] [address] [place] [address] Phone: [phone] Fax: [phone]     Social Determinants of Health (SDOH) Interventions    Readmission Risk Interventions No flowsheet data found.

## 2020-04-11 DIAGNOSIS — J441 Chronic obstructive pulmonary disease with (acute) exacerbation: Secondary | ICD-10-CM | POA: Diagnosis not present

## 2020-04-11 DIAGNOSIS — J9621 Acute and chronic respiratory failure with hypoxia: Secondary | ICD-10-CM

## 2020-04-11 DIAGNOSIS — I5031 Acute diastolic (congestive) heart failure: Secondary | ICD-10-CM | POA: Diagnosis not present

## 2020-04-11 DIAGNOSIS — J9622 Acute and chronic respiratory failure with hypercapnia: Secondary | ICD-10-CM

## 2020-04-11 DIAGNOSIS — G4700 Insomnia, unspecified: Secondary | ICD-10-CM | POA: Diagnosis not present

## 2020-04-11 LAB — BLOOD GAS, ARTERIAL
Acid-Base Excess: 8.6 mmol/L — ABNORMAL HIGH (ref 0.0–2.0)
Bicarbonate: 35.6 mmol/L — ABNORMAL HIGH (ref 20.0–28.0)
FIO2: 0.34
O2 Saturation: 97.1 %
Patient temperature: 37
pCO2 arterial: 63 mmHg — ABNORMAL HIGH (ref 32.0–48.0)
pH, Arterial: 7.36 (ref 7.350–7.450)
pO2, Arterial: 95 mmHg (ref 83.0–108.0)

## 2020-04-11 LAB — BASIC METABOLIC PANEL
Anion gap: 7 (ref 5–15)
BUN: 26 mg/dL — ABNORMAL HIGH (ref 8–23)
CO2: 33 mmol/L — ABNORMAL HIGH (ref 22–32)
Calcium: 9.1 mg/dL (ref 8.9–10.3)
Chloride: 99 mmol/L (ref 98–111)
Creatinine, Ser: 1.31 mg/dL — ABNORMAL HIGH (ref 0.61–1.24)
GFR, Estimated: 54 mL/min — ABNORMAL LOW (ref >60)
Glucose, Bld: 121 mg/dL — ABNORMAL HIGH (ref 70–99)
Potassium: 4.3 mmol/L (ref 3.5–5.1)
Sodium: 139 mmol/L (ref 135–145)

## 2020-04-11 LAB — PROCALCITONIN: Procalcitonin: <0.1 ng/mL

## 2020-04-11 MED ORDER — POLYETHYLENE GLYCOL 3350 17 G PO PACK
17.0000 g | PACK | Freq: Every day | ORAL | Status: DC
  Administered 2020-04-11 – 2020-04-12 (×2): 17 g via ORAL
  Filled 2020-04-11 (×2): qty 1

## 2020-04-11 MED ORDER — BISACODYL 5 MG PO TBEC
5.0000 mg | DELAYED_RELEASE_TABLET | Freq: Once | ORAL | Status: AC
  Administered 2020-04-11: 5 mg via ORAL
  Filled 2020-04-11: qty 1

## 2020-04-11 MED ORDER — METHYLPREDNISOLONE SODIUM SUCC 40 MG IJ SOLR
40.0000 mg | Freq: Two times a day (BID) | INTRAMUSCULAR | Status: DC
  Administered 2020-04-11 – 2020-04-15 (×9): 40 mg via INTRAVENOUS
  Filled 2020-04-11 (×9): qty 1

## 2020-04-11 MED ORDER — TORSEMIDE 20 MG PO TABS
20.0000 mg | ORAL_TABLET | Freq: Every day | ORAL | Status: DC
Start: 2020-04-11 — End: 2020-04-16
  Administered 2020-04-11 – 2020-04-15 (×5): 20 mg via ORAL
  Filled 2020-04-11 (×5): qty 1

## 2020-04-11 NOTE — Progress Notes (Signed)
Patient ID: Corey Wong, male   DOB: 09-16-1934, 86 y.o.   MRN: [id number] [facility] PROGRESS NOTE  Corey Wong YQM:[id number] DOB: Aug 23, 1934 DOA: 04/08/2020 PCP: Marina Goodell, MD  HPI/Subjective: Patient seen this morning.  He was getting a shave at that time.  The patient had his eyes closed but did answer some questions. The patient had an overnight oximetry but when I came in he was on 6 L of oxygen.  I dialed him down to 3 L.  Since he was not as alert as yesterday I did order an ABG.  Came in with congestive heart failure.  Objective: Vitals:   04/11/20 0740 04/11/20 1131  BP: (!) 132/100 (!) 106/50  Pulse: 64 71  Resp: 16 16  Temp: 97.9 F (36.6 C) 97.8 F (36.6 C)  SpO2: 99% 100%    Intake/Output Summary (Last 24 hours) at 04/11/2020 1534 Last data filed at 04/11/2020 1406 Gross per 24 hour  Intake 720 ml  Output 1625 ml  Net -905 ml   Filed Weights   04/08/20 1446  Weight: 107 kg    ROS: Review of Systems  Respiratory: Negative for shortness of breath.   Cardiovascular: Negative for chest pain.  Gastrointestinal: Negative for abdominal pain, nausea and vomiting.   Exam: Physical Exam HENT:     Head: Normocephalic.     Mouth/Throat:     Pharynx: No oropharyngeal exudate.  Eyes:     General: Lids are normal.     Conjunctiva/sclera: Conjunctivae normal.  Cardiovascular:     Rate and Rhythm: Normal rate and regular rhythm.     Heart sounds: Normal heart sounds, S1 normal and S2 normal.  Pulmonary:     Breath sounds: Examination of the right-middle field reveals decreased breath sounds and wheezing. Examination of the left-middle field reveals decreased breath sounds and wheezing. Examination of the right-lower field reveals decreased breath sounds and wheezing. Examination of the left-lower field reveals decreased breath sounds and wheezing. Decreased breath sounds and wheezing present. No rhonchi or rales.  Abdominal:     Palpations: Abdomen is  soft.     Tenderness: There is no abdominal tenderness.  Musculoskeletal:     Right ankle: Swelling present.     Left ankle: Swelling present.  Skin:    General: Skin is warm.     Comments: Chronic lower extremity skin discoloration.  Neurological:     Mental Status: He is alert.       Data Reviewed: Basic Metabolic Panel: Recent Labs  Lab 04/08/20 1458 04/09/20 0723 04/10/20 0536 04/11/20 0506  NA 136 138 139 139  K 3.3* 3.8 4.0 4.3  CL 95* 100 99 99  CO2 29 31 31  33*  GLUCOSE 108* 94 98 121*  BUN 15 15 20  26*  CREATININE 1.08 0.99 1.18 1.31*  CALCIUM 9.6 8.9 9.1 9.1  MG  --  2.3  --   --    Liver Function Tests: Recent Labs  Lab 04/08/20 1458  AST 23  ALT 11  ALKPHOS 123  BILITOT 3.8*  PROT 6.5  ALBUMIN 3.6   CBC: Recent Labs  Lab 04/08/20 1458  WBC 5.7  NEUTROABS 4.6  HGB 13.6  HCT 42.6  MCV 90.3  PLT 183   BNP (last 3 results) Recent Labs    04/08/20 1458  BNP 116.3*      Recent Results (from the past 240 hour(s))  SARS CORONAVIRUS 2 (TAT 6-24 HRS) Nasopharyngeal Nasopharyngeal Swab  Status: None   Collection Time: 04/08/20  7:28 PM   Specimen: Nasopharyngeal Swab  Result Value Ref Range Status   SARS Coronavirus 2 NEGATIVE NEGATIVE Final    Comment: (NOTE) SARS-CoV-2 target nucleic acids are NOT DETECTED.  The SARS-CoV-2 RNA is generally detectable in upper and lower respiratory specimens during the acute phase of infection. Negative results do not preclude SARS-CoV-2 infection, do not rule out co-infections with other pathogens, and should not be used as the sole basis for treatment or other patient management decisions. Negative results must be combined with clinical observations, patient history, and epidemiological information. The expected result is Negative.  Fact Sheet for Patients: HairSlick.no  Fact Sheet for Healthcare Providers: quierodirigir.com  This test  is not yet approved or cleared by the [place] FDA and  has been authorized for detection and/or diagnosis of SARS-CoV-2 by FDA under an Emergency Use Authorization (EUA). This EUA will remain  in effect (meaning this test can be used) for the duration of the COVID-19 declaration under Se ction 564(b)(1) of the Act, 21 U.S.C. section 360bbb-3(b)(1), unless the authorization is terminated or revoked sooner.  Performed at [facility], [address], [place], [place] [address]      Scheduled Meds: . aspirin EC  81 mg Oral Daily  . atorvastatin  20 mg Oral 1 day or 1 dose  . brimonidine  1 drop Both Eyes Q8H  . enoxaparin (LOVENOX) injection  0.5 mg/kg Subcutaneous Q24H  . fluticasone furoate-vilanterol  1 puff Inhalation Daily  . latanoprost  1 drop Both Eyes QHS  . methylPREDNISolone (SOLU-MEDROL) injection  40 mg Intravenous BID  . metoprolol succinate  12.5 mg Oral QHS  . nicotine  21 mg Transdermal Daily  . polyethylene glycol  17 g Oral Daily  . potassium chloride  20 mEq Oral BID  . sertraline  25 mg Oral Daily  . sodium chloride flush  3 mL Intravenous Q12H  . timolol  1 drop Both Eyes BID  . tiotropium  18 mcg Inhalation Daily  . torsemide  20 mg Oral Daily   Continuous Infusions: . sodium chloride      Assessment/Plan:  1. Acute on chronic hypoxic and hypercarbic respiratory failure. ABG today shows pH of 7.36 PCO2 of 63.  We will give a trial of BiPAP at night.  With overnight oximetry does qualify for oxygen at night (spending 40% of the night sleeping with O2 levels in the 80s.)  If he does not tolerate the BiPAP at night qualifies for nocturnal oxygen.Marland Kitchen  Keep oxygen as low as possible around 2 L.  When I came in this morning he was on 6 L of oxygen which is too much for him. 2. COPD exacerbation and diffuse wheeze.  Start Solu-Medrol and nebulizer treatments.  Continue inhalers. 3. Acute diastolic congestive heart failure with anasarca.  With creatinine  creeping up a little bit to 1.3 I will change Lasix to once a day and orally.  Low-dose Toprol XL.  Other medications limited secondary to relative hypotension. 4. Insomnia.  Had a reaction to trazodone. 5. Chronic atrial fibrillation on low-dose metoprolol.  Not on any anticoagulation only aspirin at this time. 6. Acquired thrombophilia secondary to atrial fibrillation.  Stroke risk being higher because he is not on any anticoagulation. 7. Obesity with a BMI of 35.87.  Need daily weights. 8. Right shoulder pain.  PT and OT evaluations 9. Anxiety depression continue Zoloft 10. Tobacco abuse on nicotine patch  11. Hyperlipidemia unspecified continue atorvastatin 12. Weakness.  Physical therapy recommends rehab        Code Status:     Code Status Orders  (From admission, onward)         Start     Ordered   04/08/20 2048  Full code  Continuous        04/08/20 2048        Code Status History    This patient has a current code status but no historical code status.   Advance Care Planning Activity     Family Communication: Spoke with daughter-in-law at the bedside Disposition Plan: Status is: Inpatient  Dispo: The patient is from: Home              Anticipated d/c is to: Rehab              Anticipated d/c date is: Likely 04/13/2020              Patient currently now with COPD exacerbation and acute hypoxic hypercarbic respiratory failure.  Treating with IV Lasix and BiPAP at night.  Time spent: 27 minutes  Westlyn Glaza [employer]

## 2020-04-11 NOTE — Plan of Care (Signed)
Patient waiting for placement in SNF. He was given inhaler for SOB early on during shift; SOB resolved. BP continues on 'soft side' of normal. Metoprolol held due to same. RT delivered continuous pulse ox, while turning off [place] O2. Pt later c/o SOB, therefore O2 was restarted after speaking with Dee, RT. RN repositioned pt n bed and adjusted [place] in nares to help with discomfort. Pt reported feeling better. Condom cath in place with urine emptied during shift. Low-bed in lowest position, call-bell/personal items within easy reach for patient.   Care Plan: Problem: Health Behavior/Discharge Planning: Goal: Ability to manage health-related needs will improve Outcome: Progressing   Problem: Clinical Measurements: Goal: Ability to maintain clinical measurements within normal limits will improve Outcome: Progressing Goal: Will remain free from infection Outcome: Progressing Goal: Diagnostic test results will improve Outcome: Progressing Goal: Respiratory complications will improve Outcome: Progressing Goal: Cardiovascular complication will be avoided Outcome: Progressing   Problem: Activity: Goal: Risk for activity intolerance will decrease Outcome: Progressing   Problem: Nutrition: Goal: Adequate nutrition will be maintained Outcome: Progressing   Problem: Coping: Goal: Level of anxiety will decrease Outcome: Progressing   Problem: Pain Managment: Goal: General experience of comfort will improve Outcome: Progressing

## 2020-04-12 DIAGNOSIS — I5031 Acute diastolic (congestive) heart failure: Secondary | ICD-10-CM | POA: Diagnosis not present

## 2020-04-12 DIAGNOSIS — G4700 Insomnia, unspecified: Secondary | ICD-10-CM | POA: Diagnosis not present

## 2020-04-12 DIAGNOSIS — J441 Chronic obstructive pulmonary disease with (acute) exacerbation: Secondary | ICD-10-CM | POA: Diagnosis not present

## 2020-04-12 DIAGNOSIS — J9621 Acute and chronic respiratory failure with hypoxia: Secondary | ICD-10-CM | POA: Diagnosis not present

## 2020-04-12 LAB — BASIC METABOLIC PANEL
Anion gap: 6 (ref 5–15)
BUN: 34 mg/dL — ABNORMAL HIGH (ref 8–23)
CO2: 33 mmol/L — ABNORMAL HIGH (ref 22–32)
Calcium: 9.4 mg/dL (ref 8.9–10.3)
Chloride: 100 mmol/L (ref 98–111)
Creatinine, Ser: 1.25 mg/dL — ABNORMAL HIGH (ref 0.61–1.24)
GFR, Estimated: 57 mL/min — ABNORMAL LOW (ref >60)
Glucose, Bld: 167 mg/dL — ABNORMAL HIGH (ref 70–99)
Potassium: 4.8 mmol/L (ref 3.5–5.1)
Sodium: 139 mmol/L (ref 135–145)

## 2020-04-12 LAB — SARS CORONAVIRUS 2 (TAT 6-24 HRS): SARS Coronavirus 2: POSITIVE — AB

## 2020-04-12 MED ORDER — HYDROCERIN EX CREA
TOPICAL_CREAM | Freq: Every day | CUTANEOUS | Status: DC
  Administered 2020-04-13: 1 via TOPICAL
  Filled 2020-04-12 (×2): qty 113

## 2020-04-12 MED ORDER — TEMAZEPAM 7.5 MG PO CAPS
7.5000 mg | ORAL_CAPSULE | Freq: Every evening | ORAL | Status: DC | PRN
  Administered 2020-04-14: 7.5 mg via ORAL
  Filled 2020-04-12 (×3): qty 1

## 2020-04-12 NOTE — Progress Notes (Signed)
Physical Therapy Treatment Patient Details Name: Corey Wong MRN: [id number] DOB: 11/21/1934 Today's Date: 04/12/2020    History of Present Illness Corey Wong is a 86 y.o. male with medical history significant of chronic diastolic CHF, paroxysmal A. fib with history of refusing systemic anticoagulation, COPD, cor pulmonale, HTN, IDDM, morbid obesity, macular degeneration, left-sided blindness, sleep apnea diagnosed in 2014 refuses CPAP, presented with increasing short of breath and leg swelling.    PT Comments    Pt in supine 2L donned, agreeable to session. BLE appear freshly wrapped. Pt continues to have pitting edema in the knees and thighs. 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 Pt takes part in bed level exercises to help with tightness in BLE prior to mobility. Pt does well with transition to EOB, but is very limited in helping with his arms due to chronic shoulder pathology. Pt sits at EOB for ~10 minutes , intermittent support provided due to severe weakness of trunk. Pt trial on room air, with sats at 87% at EOB after a few minutes, moved back to 2L per . Pt making gradual progress but remains severely limited, will need extensive rehab to get back to PLOF, extensive assist for ADL completion at DC.   Follow Up Recommendations  SNF     Equipment Recommendations  Other (comment)    Recommendations for Other Services       Precautions / Restrictions Precautions Precautions: Fall Restrictions Weight Bearing Restrictions: No    Mobility  Bed Mobility Overal bed mobility: Needs Assistance Bed Mobility: Supine to Sit;Sit to Supine     Supine to sit: Mod assist Sit to supine: Max assist;+2 for physical assistance   General bed mobility comments: Pt unsteady at EOB; limited use of BUE due to shoulder DJD; fearul of falling.  Transfers Overall transfer level:  (not safe to attempt)                  Ambulation/Gait                  Stairs             Wheelchair Mobility    Modified Rankin (Stroke Patients Only)       Balance   Sitting-balance support: Bilateral upper extremity supported;Feet supported Sitting balance-Leahy Scale: Poor Sitting balance - Comments: need minGuardA, intermittent minA, but tolerates 10 minutes at EOB                                    Cognition Arousal/Alertness: Awake/alert Behavior During Therapy: WFL for tasks assessed/performed Overall Cognitive Status: Within Functional Limits for tasks assessed                                        Exercises General Exercises - Lower Extremity Long Arc Quad: AROM;Both;5 reps;Seated Heel Slides: AAROM;Both;10 reps Hip ABduction/ADduction: AAROM;Both;10 reps    General Comments        Pertinent Vitals/Pain Pain Assessment: No/denies pain (chronic Rt knee DJD painful intermittently)    Home Living                      Prior Function            PT Goals (current goals can now be found in the  care plan section) Acute Rehab PT Goals Patient Stated Goal: to go home PT Goal Formulation: With patient Time For Goal Achievement: 04/23/20 Potential to Achieve Goals: Good Progress towards PT goals: Progressing toward goals    Frequency    Min 2X/week      PT Plan Current plan remains appropriate    Co-evaluation              AM-PAC PT "6 Clicks" Mobility   Outcome Measure  Help needed turning from your back to your side while in a flat bed without using bedrails?: Total Help needed moving from lying on your back to sitting on the side of a flat bed without using bedrails?: Total Help needed moving to and from a bed to a chair (including a wheelchair)?: Total Help needed standing up from a chair using your arms (e.g., wheelchair or bedside chair)?: Total Help needed to walk in hospital room?: Total Help needed climbing 3-5 steps with a railing? : Total 6  Click Score: 6    End of Session   Activity Tolerance: Patient limited by fatigue;Patient tolerated treatment well Patient left: in bed;with call bell/phone within reach Nurse Communication: Mobility status PT Visit Diagnosis: Other abnormalities of gait and mobility (R26.89);Muscle weakness (generalized) (M62.81);Difficulty in walking, not elsewhere classified (R26.2);Pain Pain - Right/Left: Right Pain - part of body: Shoulder     Time: 1350-1430 PT Time Calculation (min) (ACUTE ONLY): 40 min  Charges:  $Therapeutic Exercise: 23-37 mins                     4:15 PM, 04/12/20 Rosamaria Lints, PT, DPT Physical Therapist - [facility]  [phone] (ASCOM)    Mileena Rothenberger C 04/12/2020, 4:12 PM

## 2020-04-12 NOTE — Progress Notes (Signed)
PHARMACIST - PHYSICIAN COMMUNICATION  CONCERNING:  Enoxaparin (Lovenox) for DVT Prophylaxis    RECOMMENDATION: Patient on Enoxaparin 0.5mg /kg SQ q24h for DVT prevention. CBC monitoring every 72 hours per protocol.  Filed Weights   04/08/20 1446  Weight: 107 kg (235 lb 14.3 oz)    Body mass index is 35.87 kg/m.  Estimated Creatinine Clearance: 52.1 mL/min (A) (by C-G formula based on SCr of 1.25 mg/dL (H)).   Based on [facility] policy patient is candidate for enoxaparin 0.5mg /kg TBW SQ every 24 hours based on BMI being >30.   DESCRIPTION: Will order CBC with AM labs.  Clovia Cuff, PharmD, BCPS 04/12/2020 1:57 PM

## 2020-04-12 NOTE — TOC Progression Note (Signed)
Transition of Care [facility]) - Progression Note    Patient Details  Name: Corey Wong MRN: [id number] Date of Birth: 1934-09-01  [facility] CM/SW Contact  Trenton Founds, RN Phone Number: 04/12/2020, 2:02 PM  Clinical Narrative:   RNCM received phone call from [facility] with [facility], patient's daughter paid outstanding bill this morning and they will be able to accept patient tomorrow.     Expected Discharge Plan: Skilled Nursing Facility Barriers to Discharge: No Barriers Identified  Expected Discharge Plan and Services Expected Discharge Plan: Skilled Nursing Facility In-house Referral: Clinical Social Work   Post Acute Care Choice: Skilled Nursing Facility Living arrangements for the past 2 months: Single Family Home                                       Social Determinants of Health (SDOH) Interventions    Readmission Risk Interventions No flowsheet data found.

## 2020-04-12 NOTE — Progress Notes (Signed)
Patient called this nurse and requested BIPAP mask to be removed. This nurse removed BIPAP mask per patient request, patient put on [place] at 2L. Patient was reeducated on the purpose of the BIPAP. Patient  stated he understands the purpose of the BIPAP but he can not tolerate wearing it while sleeping tonight.

## 2020-04-12 NOTE — Consult Note (Signed)
WOC Nurse Consult Note: Reason for Consult: Patient willing to try a wrap to the LEs. Wound type:N/A Pressure Injury POA: N/A Measurement:N/A Wound bed:N/A Drainage (amount, consistency, odor) N/A Periwound:N/A Dressing procedure/placement/frequency: Patient has not had a recent ABI and in the presence of heart failure without lower extremity wounds, I have recommended to Dr. Renae Gloss that we initiate compression therapy with a "Duke Boot", i.e., a dry boot consisting of spirally wrapped Kerlix roll gauze from just below toes to just below knees topped with an ACE bandage wrapped in a similar fashion. This will provide approximately 18-21 mmHg compression and is easily tolerated in most cases.  If the "boot" becomes uncomfortable for any reason however, or if the patient wishes to bathe or shower, it is easily removed and later reapplied. Should cardiology with to implement something more aggressive post discharge, that can be implemented in the downstream care setting following obtaining an ABI.  It should be noted that patient is in bed with LEs elevated more often here in house than he may be in the next care setting or at home. Recommendation is made to Dr. Renae Gloss via Secure Chat and he subsequently approved the implementation of the dry boot while in house. This will begin today.  WOC nursing team will not follow, but will remain available to this patient, the nursing and medical teams.  Please re-consult if needed. Thanks, Ladona Mow, MSN, RN, GNP, [place]  Pager# [phone]

## 2020-04-12 NOTE — Progress Notes (Signed)
Patient ID: [name], male   DOB: Apr 12, 1934, 86 y.o.   MRN: [id number] [facility] PROGRESS NOTE  Corey Wong [id number]:[id number] DOB: 1934-05-13 DOA: 04/08/2020 PCP: Corey Goodell, MD  HPI/Subjective: Patient states that he does not feel any better.  Some cough some wheezing and shortness of breath.  Admitted with CHF exacerbation but then COPD exacerbation.  Objective: Vitals:   04/12/20 1143 04/12/20 1403  BP: (!) 107/57   Pulse: 82 84  Resp: 18   Temp: 98.1 F (36.7 C)   SpO2: 97% 91%    Intake/Output Summary (Last 24 hours) at 04/12/2020 1512 Last data filed at 04/12/2020 1058 Gross per 24 hour  Intake 243 ml  Output 1900 ml  Net -1657 ml   Filed Weights   04/08/20 1446  Weight: 107 kg    ROS: Review of Systems  Respiratory: Positive for cough, shortness of breath and wheezing.   Cardiovascular: Negative for chest pain.  Gastrointestinal: Negative for abdominal pain, nausea and vomiting.   Exam: Physical Exam HENT:     Head: Normocephalic.     Mouth/Throat:     Pharynx: No oropharyngeal exudate.  Eyes:     General: Lids are normal.     Conjunctiva/sclera: Conjunctivae normal.     Pupils: Pupils are equal, round, and reactive to light.  Cardiovascular:     Rate and Rhythm: Normal rate and regular rhythm.     Heart sounds: Normal heart sounds, S1 normal and S2 normal.  Pulmonary:     Breath sounds: Examination of the right-lower field reveals decreased breath sounds and wheezing. Examination of the left-lower field reveals decreased breath sounds and wheezing. Decreased breath sounds and wheezing present. No rhonchi or rales.  Abdominal:     Palpations: Abdomen is soft.     Tenderness: There is no abdominal tenderness.  Musculoskeletal:     Right lower leg: Swelling present.     Left lower leg: Swelling present.  Skin:    General: Skin is warm.     Comments: Chronic lower extremity skin discoloration.  Neurological:     Mental Status: He is  alert and oriented to person, place, and time.       Data Reviewed: Basic Metabolic Panel: Recent Labs  Lab 04/08/20 1458 04/09/20 0723 04/10/20 0536 04/11/20 0506 04/12/20 0258  NA 136 138 139 139 139  K 3.3* 3.8 4.0 4.3 4.8  CL 95* 100 99 99 100  CO2 29 31 31  33* 33*  GLUCOSE 108* 94 98 121* 167*  BUN 15 15 20  26* 34*  CREATININE 1.08 0.99 1.18 1.31* 1.25*  CALCIUM 9.6 8.9 9.1 9.1 9.4  MG  --  2.3  --   --   --    Liver Function Tests: Recent Labs  Lab 04/08/20 1458  AST 23  ALT 11  ALKPHOS 123  BILITOT 3.8*  PROT 6.5  ALBUMIN 3.6   CBC: Recent Labs  Lab 04/08/20 1458  WBC 5.7  NEUTROABS 4.6  HGB 13.6  HCT 42.6  MCV 90.3  PLT 183   BNP (last 3 results) Recent Labs    04/08/20 1458  BNP 116.3*      Recent Results (from the past 240 hour(s))  SARS CORONAVIRUS 2 (TAT 6-24 HRS) Nasopharyngeal Nasopharyngeal Swab     Status: None   Collection Time: 04/08/20  7:28 PM   Specimen: Nasopharyngeal Swab  Result Value Ref Range Status   SARS Coronavirus 2 NEGATIVE NEGATIVE Final  Comment: (NOTE) SARS-CoV-2 target nucleic acids are NOT DETECTED.  The SARS-CoV-2 RNA is generally detectable in upper and lower respiratory specimens during the acute phase of infection. Negative results do not preclude SARS-CoV-2 infection, do not rule out co-infections with other pathogens, and should not be used as the sole basis for treatment or other patient management decisions. Negative results must be combined with clinical observations, patient history, and epidemiological information. The expected result is Negative.  Fact Sheet for Patients: HairSlick.no  Fact Sheet for Healthcare Providers: quierodirigir.com  This test is not yet approved or cleared by the [place] FDA and  has been authorized for detection and/or diagnosis of SARS-CoV-2 by FDA under an Emergency Use Authorization (EUA). This EUA  will remain  in effect (meaning this test can be used) for the duration of the COVID-19 declaration under Se ction 564(b)(1) of the Act, 21 U.S.C. section 360bbb-3(b)(1), unless the authorization is terminated or revoked sooner.  Performed at [facility], [address], [place], [place] [address]       Scheduled Meds: . aspirin EC  81 mg Oral Daily  . atorvastatin  20 mg Oral 1 day or 1 dose  . brimonidine  1 drop Both Eyes Q8H  . enoxaparin (LOVENOX) injection  0.5 mg/kg Subcutaneous Q24H  . fluticasone furoate-vilanterol  1 puff Inhalation Daily  . hydrocerin   Topical Daily  . latanoprost  1 drop Both Eyes QHS  . methylPREDNISolone (SOLU-MEDROL) injection  40 mg Intravenous BID  . metoprolol succinate  12.5 mg Oral QHS  . nicotine  21 mg Transdermal Daily  . polyethylene glycol  17 g Oral Daily  . potassium chloride  20 mEq Oral BID  . sertraline  25 mg Oral Daily  . sodium chloride flush  3 mL Intravenous Q12H  . timolol  1 drop Both Eyes BID  . tiotropium  18 mcg Inhalation Daily  . torsemide  20 mg Oral Daily   Continuous Infusions: . sodium chloride      Assessment/Plan:  1. Acute on chronic hypoxic and hypercarbic respiratory failure.  ABG showed a pH of 7.36 and a PCO2 of 63.  Unable to tolerate BiPAP at night.  I did do an overnight oximetry and he does qualify for nocturnal oxygen with spending 40% of the night sleeping with oxygen level in the 80s.  Continue oxygen 2 L nasal cannula.  Avoid cranking the oxygen up.  Rather have him more hypoxic. 2. COPD exacerbation with wheeze.  Continue Solu-Medrol and nebulizer treatments.  Continue inhalers.  Better air entry today. 3. Insomnia.  Had a reaction to trazodone while at home so this medication was discontinued.  Oxygen at night hopefully will be helpful. 4. Acute diastolic congestive heart failure with anasarca.  Continue to monitor closely with creatinine starting to creep up a little bit.  On low-dose  Toprol-XL and low-dose torsemide. 5. Chronic atrial fibrillation on low-dose metoprolol.  Not on any anticoagulation.  Only aspirin at this time 6. Acquired thrombophilia secondary to atrial fibrillation.  Stroke risk higher being not on any anticoagulation. 7. Obesity with a BMI of 35.87.  Unable to get a bed weight on the patient and he is unable to get up on the scale at this point. 8. Right shoulder pain.  Continue PT and OT evaluations. 9. Anxiety depression on Zoloft 10. Hyperlipidemia unspecified on atorvastatin 11. Weakness.  Physical therapy recommends rehab 12. Tobacco abuse on nicotine patch 13. Acute kidney injury with  diuresis.  Creatinine is low at 0.99 on 04/09/2020.  It went as high as 1.31 on 04/11/2020.  Down to 1.25 today.  Watch closely while on Demadex.        Code Status:     Code Status Orders  (From admission, onward)         Start     Ordered   04/08/20 2048  Full code  Continuous        04/08/20 2048        Code Status History    This patient has a current code status but no historical code status.   Advance Care Planning Activity     Family Communication: Spoke with daughter-in-law on the phone Disposition Plan: Status is: Inpatient  Dispo: The patient is from: Home              Anticipated d/c is to: Rehab              Anticipated d/c date is: Hopefully 04/13/2020              Patient currently being treated for COPD exacerbation with IV Solu-Medrol  Time spent: 28 minutes  Corey Wong [employer]

## 2020-04-12 NOTE — Care Management Important Message (Signed)
Important Message  Patient Details  Name: Corey Wong MRN: [id number] Date of Birth: 08-21-1934   Medicare Important Message Given:  Yes     [name] 04/12/2020, 11:15 AM

## 2020-04-13 ENCOUNTER — Encounter (INDEPENDENT_AMBULATORY_CARE_PROVIDER_SITE_OTHER): Payer: Medicare Other

## 2020-04-13 ENCOUNTER — Ambulatory Visit (INDEPENDENT_AMBULATORY_CARE_PROVIDER_SITE_OTHER): Payer: Medicare Other | Admitting: Nurse Practitioner

## 2020-04-13 DIAGNOSIS — I5031 Acute diastolic (congestive) heart failure: Secondary | ICD-10-CM | POA: Diagnosis not present

## 2020-04-13 DIAGNOSIS — J9621 Acute and chronic respiratory failure with hypoxia: Secondary | ICD-10-CM | POA: Diagnosis not present

## 2020-04-13 DIAGNOSIS — D62 Acute posthemorrhagic anemia: Secondary | ICD-10-CM

## 2020-04-13 DIAGNOSIS — U071 COVID-19: Secondary | ICD-10-CM | POA: Diagnosis not present

## 2020-04-13 DIAGNOSIS — J441 Chronic obstructive pulmonary disease with (acute) exacerbation: Secondary | ICD-10-CM | POA: Diagnosis not present

## 2020-04-13 LAB — BASIC METABOLIC PANEL
Anion gap: 10 (ref 5–15)
BUN: 46 mg/dL — ABNORMAL HIGH (ref 8–23)
CO2: 32 mmol/L (ref 22–32)
Calcium: 9.6 mg/dL (ref 8.9–10.3)
Chloride: 95 mmol/L — ABNORMAL LOW (ref 98–111)
Creatinine, Ser: 1.26 mg/dL — ABNORMAL HIGH (ref 0.61–1.24)
GFR, Estimated: 56 mL/min — ABNORMAL LOW (ref >60)
Glucose, Bld: 155 mg/dL — ABNORMAL HIGH (ref 70–99)
Potassium: 5.2 mmol/L — ABNORMAL HIGH (ref 3.5–5.1)
Sodium: 137 mmol/L (ref 135–145)

## 2020-04-13 LAB — CBC
HCT: 29.8 % — ABNORMAL LOW (ref 39.0–52.0)
Hemoglobin: 9.6 g/dL — ABNORMAL LOW (ref 13.0–17.0)
MCH: 29.5 pg (ref 26.0–34.0)
MCHC: 32.2 g/dL (ref 30.0–36.0)
MCV: 91.7 fL (ref 80.0–100.0)
Platelets: 164 10*3/uL (ref 150–400)
RBC: 3.25 MIL/uL — ABNORMAL LOW (ref 4.22–5.81)
RDW: 15.3 % (ref 11.5–15.5)
WBC: 4.4 10*3/uL (ref 4.0–10.5)
nRBC: 0 % (ref 0.0–0.2)

## 2020-04-13 LAB — HEMOGLOBIN: Hemoglobin: 10.4 g/dL — ABNORMAL LOW (ref 13.0–17.0)

## 2020-04-13 LAB — C-REACTIVE PROTEIN: CRP: 0.9 mg/dL (ref <1.0)

## 2020-04-13 LAB — FERRITIN: Ferritin: 29 ng/mL (ref 24–336)

## 2020-04-13 LAB — D-DIMER, QUANTITATIVE: D-Dimer, Quant: 0.48 ug/mL-FEU (ref 0.00–0.50)

## 2020-04-13 MED ORDER — POLYETHYLENE GLYCOL 3350 17 G PO PACK
17.0000 g | PACK | Freq: Every day | ORAL | Status: DC | PRN
  Administered 2020-04-16 – 2020-04-20 (×2): 17 g via ORAL
  Filled 2020-04-13 (×2): qty 1

## 2020-04-13 MED ORDER — SODIUM CHLORIDE 0.9 % IV SOLN
200.0000 mg | Freq: Once | INTRAVENOUS | Status: AC
  Administered 2020-04-13: 200 mg via INTRAVENOUS
  Filled 2020-04-13: qty 200

## 2020-04-13 MED ORDER — ALUM & MAG HYDROXIDE-SIMETH 200-200-20 MG/5ML PO SUSP
30.0000 mL | Freq: Four times a day (QID) | ORAL | Status: DC | PRN
  Administered 2020-04-13: 30 mL via ORAL
  Filled 2020-04-13: qty 30

## 2020-04-13 MED ORDER — FERROUS SULFATE 325 (65 FE) MG PO TABS
325.0000 mg | ORAL_TABLET | Freq: Two times a day (BID) | ORAL | Status: DC
  Administered 2020-04-13 – 2020-04-22 (×18): 325 mg via ORAL
  Filled 2020-04-13 (×18): qty 1

## 2020-04-13 MED ORDER — ZINC SULFATE 220 (50 ZN) MG PO CAPS
220.0000 mg | ORAL_CAPSULE | Freq: Every day | ORAL | Status: DC
  Administered 2020-04-13 – 2020-04-22 (×10): 220 mg via ORAL
  Filled 2020-04-13 (×10): qty 1

## 2020-04-13 MED ORDER — SODIUM CHLORIDE 0.9 % IV SOLN
100.0000 mg | Freq: Every day | INTRAVENOUS | Status: AC
  Administered 2020-04-14 – 2020-04-17 (×4): 100 mg via INTRAVENOUS
  Filled 2020-04-13 (×5): qty 20

## 2020-04-13 MED ORDER — ASCORBIC ACID 500 MG PO TABS
500.0000 mg | ORAL_TABLET | Freq: Every day | ORAL | Status: DC
  Administered 2020-04-13 – 2020-04-22 (×10): 500 mg via ORAL
  Filled 2020-04-13 (×10): qty 1

## 2020-04-13 NOTE — Progress Notes (Signed)
Remdesivir - Pharmacy Brief Note   O:  ALT: 11  (04/08/20) CXR: 04/08/20: Retrocardiac opacities, which could represent atelectasis, aspiration, and/or pneumonia SpO2: 100% on 2.5 L/min  Covid test 04/08/20 negative Covid test 04/12/20 positive     A/P:  Remdesivir 200 mg IVPB once followed by 100 mg IVPB daily x 4 days.   Bari Mantis PharmD Clinical Pharmacist 04/13/2020

## 2020-04-13 NOTE — Progress Notes (Signed)
Patient ID: Corey Wong, male   DOB: May 02, 1934, 86 y.o.   MRN: [id number] [facility] PROGRESS NOTE  Corey Wong IRJ:[id number] DOB: Jun 13, 1934 DOA: 04/08/2020 PCP: Marina Goodell, MD  HPI/Subjective: Patient feels okay.  Breathing is okay.  Still always has some shortness of breath.  Some cough.  No abdominal pain.  No nausea or vomiting.  Had some bowel movements while here.  No blood seen.  Objective: Vitals:   04/13/20 0433 04/13/20 0949  BP: 117/69 106/74  Pulse: (!) 57 64  Resp: 16 19  Temp: 97.9 F (36.6 C) 98.6 F (37 C)  SpO2: 99% 100%   No intake or output data in the 24 hours ending 04/13/20 1315 Filed Weights   04/08/20 1446 04/13/20 0440  Weight: 107 kg 39.9 kg    ROS: Review of Systems  Respiratory: Positive for cough and shortness of breath.   Cardiovascular: Negative for chest pain.  Gastrointestinal: Negative for abdominal pain, nausea and vomiting.   Exam: Physical Exam HENT:     Head: Normocephalic.     Mouth/Throat:     Pharynx: No oropharyngeal exudate.  Eyes:     General: Lids are normal.     Conjunctiva/sclera: Conjunctivae normal.  Cardiovascular:     Rate and Rhythm: Normal rate and regular rhythm.     Heart sounds: Normal heart sounds, S1 normal and S2 normal.  Pulmonary:     Breath sounds: Examination of the right-lower field reveals decreased breath sounds and wheezing. Examination of the left-lower field reveals decreased breath sounds and wheezing. Decreased breath sounds and wheezing present. No rhonchi or rales.  Abdominal:     Palpations: Abdomen is soft.     Tenderness: There is no abdominal tenderness.  Musculoskeletal:     Right ankle: Swelling present.     Left ankle: Swelling present.  Skin:    General: Skin is warm.     Comments: Chronic lower extremity anasarca and discoloration.  Neurological:     Mental Status: He is alert and oriented to person, place, and time.       Data Reviewed: Basic Metabolic  Panel: Recent Labs  Lab 04/09/20 0723 04/10/20 0536 04/11/20 0506 04/12/20 0258 04/13/20 0820  NA 138 139 139 139 137  K 3.8 4.0 4.3 4.8 5.2*  CL 100 99 99 100 95*  CO2 31 31 33* 33* 32  GLUCOSE 94 98 121* 167* 155*  BUN 15 20 26* 34* 46*  CREATININE 0.99 1.18 1.31* 1.25* 1.26*  CALCIUM 8.9 9.1 9.1 9.4 9.6  MG 2.3  --   --   --   --    Liver Function Tests: Recent Labs  Lab 04/08/20 1458  AST 23  ALT 11  ALKPHOS 123  BILITOT 3.8*  PROT 6.5  ALBUMIN 3.6   CBC: Recent Labs  Lab 04/08/20 1458 04/13/20 0820  WBC 5.7 4.4  NEUTROABS 4.6  --   HGB 13.6 9.6*  HCT 42.6 29.8*  MCV 90.3 91.7  PLT 183 164   BNP (last 3 results) Recent Labs    04/08/20 1458  BNP 116.3*     Recent Results (from the past 240 hour(s))  SARS CORONAVIRUS 2 (TAT 6-24 HRS) Nasopharyngeal Nasopharyngeal Swab     Status: None   Collection Time: 04/08/20  7:28 PM   Specimen: Nasopharyngeal Swab  Result Value Ref Range Status   SARS Coronavirus 2 NEGATIVE NEGATIVE Final    Comment: (NOTE) SARS-CoV-2 target nucleic acids are NOT  DETECTED.  The SARS-CoV-2 RNA is generally detectable in upper and lower respiratory specimens during the acute phase of infection. Negative results do not preclude SARS-CoV-2 infection, do not rule out co-infections with other pathogens, and should not be used as the sole basis for treatment or other patient management decisions. Negative results must be combined with clinical observations, patient history, and epidemiological information. The expected result is Negative.  Fact Sheet for Patients: HairSlick.no  Fact Sheet for Healthcare Providers: quierodirigir.com  This test is not yet approved or cleared by the [place] FDA and  has been authorized for detection and/or diagnosis of SARS-CoV-2 by FDA under an Emergency Use Authorization (EUA). This EUA will remain  in effect (meaning this test can  be used) for the duration of the COVID-19 declaration under Se ction 564(b)(1) of the Act, 21 U.S.C. section 360bbb-3(b)(1), unless the authorization is terminated or revoked sooner.  Performed at [facility], [address] [place], [place] [address]   SARS CORONAVIRUS 2 (TAT 6-24 HRS) Nasopharyngeal Nasopharyngeal Swab     Status: Abnormal   Collection Time: 04/12/20 11:32 AM   Specimen: Nasopharyngeal Swab  Result Value Ref Range Status   SARS Coronavirus 2 POSITIVE (A) NEGATIVE Final    Comment: (NOTE) SARS-CoV-2 target nucleic acids are DETECTED.  The SARS-CoV-2 RNA is generally detectable in upper and lower respiratory specimens during the acute phase of infection. Positive results are indicative of the presence of SARS-CoV-2 RNA. Clinical correlation with patient history and other diagnostic information is  necessary to determine patient infection status. Positive results do not rule out bacterial infection or co-infection with other viruses.  The expected result is Negative.  Fact Sheet for Patients: HairSlick.no  Fact Sheet for Healthcare Providers: quierodirigir.com  This test is not yet approved or cleared by the [place] FDA and  has been authorized for detection and/or diagnosis of SARS-CoV-2 by FDA under an Emergency Use Authorization (EUA). This EUA will remain  in effect (meaning this test can be used) for the duration of the COVID-19 declaration under Section 564(b)(1) of the Act, 21 U. S.C. section 360bbb-3(b)(1), unless the authorization is terminated or revoked sooner.   Performed at [facility], [address], [place], [place] [address]       Scheduled Meds: . vitamin C  500 mg Oral Daily  . atorvastatin  20 mg Oral 1 day or 1 dose  . brimonidine  1 drop Both Eyes Q8H  . enoxaparin (LOVENOX) injection  0.5 mg/kg Subcutaneous Q24H  . ferrous sulfate  325 mg Oral BID WC  .  fluticasone furoate-vilanterol  1 puff Inhalation Daily  . hydrocerin   Topical Daily  . latanoprost  1 drop Both Eyes QHS  . methylPREDNISolone (SOLU-MEDROL) injection  40 mg Intravenous BID  . metoprolol succinate  12.5 mg Oral QHS  . nicotine  21 mg Transdermal Daily  . sertraline  25 mg Oral Daily  . sodium chloride flush  3 mL Intravenous Q12H  . timolol  1 drop Both Eyes BID  . tiotropium  18 mcg Inhalation Daily  . torsemide  20 mg Oral Daily  . zinc sulfate  220 mg Oral Daily   Continuous Infusions: . sodium chloride    . [START ON 04/14/2020] remdesivir 100 mg in NS 100 mL     Brief history:.  Patient initially admitted with CHF exacerbation and was diuresed with IV Lasix.  When his creatinine started to creep up a  little bit I switched over to oral Demadex.  2 days ago he did have COPD exacerbation and I started steroids.  Steroids.  I was hoping to send out to rehab on 04/13/2020 but routine COVID test came back positive.  Remdesivir ordered.  Patient was already on steroids.  Assessment/Plan:  1. COVID-19 positive with COPD exacerbation.  His initial COVID test was negative on admission on 04/08/2020.  Ferritin low at 29.  Fibrin products and CRP still pending.  Patient already on steroids.  Add zinc and vitamin C.  Nebulizers changed to inhalers. 2. Acute on chronic hypoxic hyper cardiac respiratory failure.  ABG showed a pH of 7.36 and a PCO2 of 63.  The patient was unable to tolerate BiPAP 2 nights ago.  This was discontinued.  Continue oxygen at night.  Overnight oximetry did show that he was hypoxic for quite a bit of the night with pulse ox in the 80s so he does qualify for nocturnal oxygen anyway. 3. Insomnia.  Patient had a reaction to trazodone while at home so this medication was discontinued.  Hopefully oxygen at night will be helpful.  As needed Restoril. 4. Acute diastolic congestive heart failure with anasarca.  Requested leg wraps which the wound care team wrapped his  legs.  Continue low-dose Toprol and low-dose torsemide.  Patient unable to stand to get a daily weight in the bed weight is not working. 5. Acute kidney injury with diuresis.  Creatinine was as low as 0.99 on 04/09/2020.  It went as high as 1.31 on 04/11/2020.  Today creatinine 1.26. 6. Large drop in hemoglobin from admission to today.  Check another hemoglobin this afternoon.  I discontinued aspirin.  Check another hemoglobin tomorrow morning.  May have to discontinue Lovenox if hemoglobin drops further.  Bowel movements described as brown.  Stool guaiac.  Check an LDH also 7. Chronic atrial fibrillation on low-dose metoprolol.  Not on any anticoagulation.  Aspirin also held with drop in hemoglobin. 8. Acquired thrombophilia secondary to atrial fibrillation.  Stroke risk higher being not on any anticoagulation. 9. Obesity with a BMI of 35.87.  The last weight in the computer is wrong. 10. Right shoulder pain.  Continue PT and OT evaluations 11. Anxiety depression on Zoloft 12. Hyperlipidemia unspecified on atorvastatin 13. Tobacco abuse on nicotine patch 14. Weakness.  Physical therapy recommends rehab    Code Status:     Code Status Orders  (From admission, onward)         Start     Ordered   04/08/20 2048  Full code  Continuous        04/08/20 2048        Code Status History    This patient has a current code status but no historical code status.   Advance Care Planning Activity     Family Communication: Spoke with daughter-in-law on the phone Disposition Plan: Status is: Inpatient  Dispo: The patient is from: Home              Anticipated d/c is to: Rehab              Anticipated d/c date is: The COVID test prior to going out to rehab came back positive.  We will have to look into a new rehab that takes COVID-positive patients.              Patient currently being treated for COPD exacerbation and COVID-19.  Currently on IV steroids and IV remdesivir.  Time spent: 28  minutes  Berdella Bacot [employer]  [facility]

## 2020-04-13 NOTE — TOC Progression Note (Signed)
Transition of Care [facility]) - Progression Note    Patient Details  Name: Corey Wong MRN: [id number] Date of Birth: [birth year]  Transition of Care [facility] CM/SW Contact  Trenton Founds, RN Phone Number: 04/13/2020, 3:18 PM  Clinical Narrative:  RNCM reached out to [facility] with [facility] to inform him that patient is now Covid positive, facility will not be able to accept until 10 days after positive diagnosis.      Expected Discharge Plan: Skilled Nursing Facility Barriers to Discharge: No Barriers Identified  Expected Discharge Plan and Services Expected Discharge Plan: Skilled Nursing Facility In-house Referral: Clinical Social Work   Post Acute Care Choice: Skilled Nursing Facility Living arrangements for the past 2 months: Single Family Home                                       Social Determinants of Health (SDOH) Interventions    Readmission Risk Interventions No flowsheet data found.

## 2020-04-14 DIAGNOSIS — J9621 Acute and chronic respiratory failure with hypoxia: Secondary | ICD-10-CM | POA: Diagnosis not present

## 2020-04-14 DIAGNOSIS — J9622 Acute and chronic respiratory failure with hypercapnia: Secondary | ICD-10-CM | POA: Diagnosis not present

## 2020-04-14 DIAGNOSIS — R601 Generalized edema: Secondary | ICD-10-CM | POA: Diagnosis not present

## 2020-04-14 DIAGNOSIS — I5031 Acute diastolic (congestive) heart failure: Secondary | ICD-10-CM | POA: Diagnosis not present

## 2020-04-14 LAB — CBC
HCT: 29.7 % — ABNORMAL LOW (ref 39.0–52.0)
Hemoglobin: 9.5 g/dL — ABNORMAL LOW (ref 13.0–17.0)
MCH: 29.2 pg (ref 26.0–34.0)
MCHC: 32 g/dL (ref 30.0–36.0)
MCV: 91.4 fL (ref 80.0–100.0)
Platelets: 180 10*3/uL (ref 150–400)
RBC: 3.25 MIL/uL — ABNORMAL LOW (ref 4.22–5.81)
RDW: 15.2 % (ref 11.5–15.5)
WBC: 5 10*3/uL (ref 4.0–10.5)
nRBC: 0 % (ref 0.0–0.2)

## 2020-04-14 LAB — COMPREHENSIVE METABOLIC PANEL
ALT: 27 U/L (ref 0–44)
AST: 26 U/L (ref 15–41)
Albumin: 2.8 g/dL — ABNORMAL LOW (ref 3.5–5.0)
Alkaline Phosphatase: 77 U/L (ref 38–126)
Anion gap: 7 (ref 5–15)
BUN: 54 mg/dL — ABNORMAL HIGH (ref 8–23)
CO2: 34 mmol/L — ABNORMAL HIGH (ref 22–32)
Calcium: 9.5 mg/dL (ref 8.9–10.3)
Chloride: 99 mmol/L (ref 98–111)
Creatinine, Ser: 1.35 mg/dL — ABNORMAL HIGH (ref 0.61–1.24)
GFR, Estimated: 52 mL/min — ABNORMAL LOW (ref >60)
Glucose, Bld: 146 mg/dL — ABNORMAL HIGH (ref 70–99)
Potassium: 5.3 mmol/L — ABNORMAL HIGH (ref 3.5–5.1)
Sodium: 140 mmol/L (ref 135–145)
Total Bilirubin: 1.7 mg/dL — ABNORMAL HIGH (ref 0.3–1.2)
Total Protein: 5.3 g/dL — ABNORMAL LOW (ref 6.5–8.1)

## 2020-04-14 LAB — FERRITIN: Ferritin: 28 ng/mL (ref 24–336)

## 2020-04-14 LAB — FIBRIN DERIVATIVES D-DIMER (ARMC ONLY): Fibrin derivatives D-dimer (ARMC): 630.83 ng/mL (FEU) — ABNORMAL HIGH (ref 0.00–499.00)

## 2020-04-14 LAB — LACTATE DEHYDROGENASE: LDH: 96 U/L — ABNORMAL LOW (ref 98–192)

## 2020-04-14 LAB — C-REACTIVE PROTEIN: CRP: 0.7 mg/dL

## 2020-04-14 MED ORDER — SODIUM ZIRCONIUM CYCLOSILICATE 10 G PO PACK
10.0000 g | PACK | Freq: Once | ORAL | Status: DC
  Filled 2020-04-14: qty 1

## 2020-04-14 MED ORDER — ENOXAPARIN SODIUM 30 MG/0.3ML ~~LOC~~ SOLN: 30.0000 mg | SUBCUTANEOUS | Status: DC

## 2020-04-14 MED ORDER — ENOXAPARIN SODIUM 40 MG/0.4ML ~~LOC~~ SOLN
40.0000 mg | SUBCUTANEOUS | Status: DC
  Administered 2020-04-14: 40 mg via SUBCUTANEOUS
  Filled 2020-04-14: qty 0.4

## 2020-04-14 NOTE — Progress Notes (Signed)
Patient's daughter-in-law Alvino Chapel updated via phone call on POC.

## 2020-04-14 NOTE — Hospital Course (Signed)
Corey Wong is a 86 y.o. male with medical history significant of chronic diastolic CHF, paroxysmal A. fib with history of refusing systemic anticoagulation, COPD, cor pulmonale, HTN, IDDM, morbid obesity, macular degeneration, left-sided blindness, sleep apnea diagnosed in 2014 refuses CPAP, presented with increasing short of breath, generalized weakness and leg swelling.  Patient was initially admitted for management of acute CHF decompensation.  Discharge to short-term rehab was being planned and patient had to be screened for COVID-19 which resulted positive.

## 2020-04-14 NOTE — Progress Notes (Addendum)
PROGRESS NOTE    Corey Wong   ZOX:[id number]  DOB: 04/17/1934  PCP: Marina GoodellFeldpausch, Dale E, MD    DOA: 04/08/2020 LOS: 6   Brief Narrative   Corey Wong is a [age] y.o. male with medical history significant of chronic diastolic CHF, paroxysmal A. fib with history of refusing systemic anticoagulation, COPD, cor pulmonale, HTN, IDDM, morbid obesity, macular degeneration, left-sided blindness, sleep apnea diagnosed in 2014 refuses CPAP, presented with increasing short of breath, generalized weakness and leg swelling.  Patient was initially admitted for management of acute CHF decompensation.  Discharge to short-term rehab was being planned and patient had to be screened for COVID-19 which resulted positive.     Assessment & Plan   Active Problems:   Acute diastolic CHF (congestive heart failure) (HCC)   COPD with acute exacerbation (HCC)   Acute CHF (congestive heart failure) (HCC)   CHF (congestive heart failure) (HCC)   Insomnia   Chronic a-fib (HCC)   Obesity (BMI 30-39.9)   Anxiety and depression   Anasarca   Acquired thrombophilia (HCC)   Acute on chronic respiratory failure with hypoxia and hypercapnia (HCC)   COVID-19 virus infection   Acute on chronic respiratory failure with hypoxia -due to COVID-19 and COPD.  BiPAP was tried on 2 nights but not tolerated.  Continue supplemental oxygen at night and as needed during the day overnight oximetry with quite a bit of time with SPO2 in the 80s. --Continue supplemental oxygen as needed to maintain O2 sat greater than 88%, wean as tolerated  COVID-19 infection with COPD exacerbation -patient tested negative for COVID on 1/6, subsequently positive when screened for SNF placement. -- Continue steroids, zinc, vitamin C, inhalers per orders  Acute on chronic diastolic CHF with anasarca - Net IO Since Admission: -4,512 mL [04/14/20 1426] Has been transitioned from IV diuresis back to torsemide. --Continue Toprol and torsemide --  Strict I/O's and daily weights -- Leg wraps for edema  Acute normocytic anemia -not present on admission.  Hemoglobin was initially 13.6, dropped to 9.6 on 1/11.  No evidence of bleeding.  Follow-up stool guaiac, but stools reported as brown.  No reports of melena or hematochezia.  LDH is slightly low. --Continue holding aspirin.   --Hemoglobin stable today, okay to continue Lovenox for now.   -- Monitor CBC  Hyperkalemia -K5.3 this morning (1/12).  10 g Lokelma given.  Monitor BMP.  AKI -due to diuresis.  Creatinine on 1/7 was 0.99, today up to 1.35.  Monitor BMP.  Generalized weakness -SNF recommended by therapy for short-term rehab.  TOC working on placement (patient had been accepted at [facility] and still wants to go there however will not be excepted now until the 21st due to COVID positive).  Hopefully we can find a bed at a facility that will take him before then.  Continue therapy here is much as possible.  Chronic A. Fib -rate controlled.  Continue metoprolol.  Not on anticoagulation.  Aspirin currently held due to anemia.  Acquired thrombophilia secondary to A. Fib -patient is at higher risk of stroke as he is not on anticoagulation for A. Fib.  Right shoulder pain -improved.  PT and OT eval's.  Depression/anxiety -continue Zoloft  Hyperlipidemia -continue Lipitor  Tobacco abuse -counseled on importance of cessation.  Nicotine patch ordered.  Insomnia -continue trazodone      DVT prophylaxis: enoxaparin (LOVENOX) injection 40 mg Start: 04/14/20 2200   Diet:  Diet Orders (From admission, onward)  Start     Ordered   04/08/20 2048  Diet Heart Room service appropriate? Yes; Fluid consistency: Thin; Fluid restriction: 1800 mL Fluid  Diet effective now       Question Answer Comment  Room service appropriate? Yes   Fluid consistency: Thin   Fluid restriction: 1800 mL Fluid      04/08/20 2048            Code Status: Full Code    Subjective 04/14/20    Patient  seen today at bedside.  He reports constant coughing all day and night, unable to sleep.  He repeatedly nods off while I am speaking to him during the encounter.  Nursing report the same.  No acute events reported and patient denies other acute complaints.   Disposition Plan & Communication   Status is: Inpatient  Remains inpatient appropriate because:IV treatments appropriate due to intensity of illness or inability to take PO.  Currently with AKI and acute anemia requiring further monitoring and evaluation.     Dispo: The patient is from: Home              Anticipated d/c is to: SNF              Anticipated d/c date is: 2 days              Patient currently is not medically stable to d/c.   Family Communication: None at bedside, will attempt to call.   Consults, Procedures, Significant Events   Consultants:   None  Procedures:   None  Antimicrobials:  Anti-infectives (From admission, onward)   Start     Dose/Rate Route Frequency Ordered Stop   04/14/20 1000  remdesivir 100 mg in sodium chloride 0.9 % 100 mL IVPB       "Followed by" Linked Group Details   100 mg 200 mL/hr over 30 Minutes Intravenous Daily 04/13/20 1004 04/18/20 0959   04/13/20 1130  remdesivir 200 mg in sodium chloride 0.9% 250 mL IVPB       "Followed by" Linked Group Details   200 mg 580 mL/hr over 30 Minutes Intravenous Once 04/13/20 1004 04/13/20 1206   04/08/20 2200  ciprofloxacin (CIPRO) tablet 500 mg  Status:  Discontinued        500 mg Oral 2 times daily 04/08/20 2048 04/09/20 1131         Objective   Vitals:   04/14/20 0244 04/14/20 0502 04/14/20 0700 04/14/20 1135  BP: (!) 104/52 108/62 110/68 105/69  Pulse: 83 63 65 88  Resp:  19 19 18   Temp:  98.5 F (36.9 C) 98.4 F (36.9 C) 97.8 F (36.6 C)  TempSrc:      SpO2:  98% 98% 100%  Weight:      Height:        Intake/Output Summary (Last 24 hours) at 04/14/2020 1424 Last data filed at 04/14/2020 0944 Gross per 24 hour  Intake  120 ml  Output --  Net 120 ml   Filed Weights   04/08/20 1446 04/13/20 0440  Weight: 107 kg 39.9 kg    Physical Exam:  General exam: awake but nodding off to sleep, no acute distress HEENT: moist mucus membranes, hearing grossly normal  Respiratory system: CTAB but diminished, no wheezes or rhonchi, normal respiratory effort, on 2 L/min nasal cannula oxygen. Cardiovascular system: normal S1/S2, RRR, no pedal edema.   Gastrointestinal system: soft, NT, ND Central nervous system: no gross focal neurologic deficits, normal speech Extremities:  moves all, no edema, normal tone Psychiatry: normal mood, congruent affect, judgement and insight appear normal  Labs   Data Reviewed: I have personally reviewed following labs and imaging studies  CBC: Recent Labs  Lab 04/08/20 1458 04/13/20 0820 04/13/20 1307 04/14/20 0643  WBC 5.7 4.4  --  5.0  NEUTROABS 4.6  --   --   --   HGB 13.6 9.6* 10.4* 9.5*  HCT 42.6 29.8*  --  29.7*  MCV 90.3 91.7  --  91.4  PLT 183 164  --  180   Basic Metabolic Panel: Recent Labs  Lab 04/09/20 0723 04/10/20 0536 04/11/20 0506 04/12/20 0258 04/13/20 0820 04/14/20 0643  NA 138 139 139 139 137 140  K 3.8 4.0 4.3 4.8 5.2* 5.3*  CL 100 99 99 100 95* 99  CO2 31 31 33* 33* 32 34*  GLUCOSE 94 98 121* 167* 155* 146*  BUN 15 20 26* 34* 46* 54*  CREATININE 0.99 1.18 1.31* 1.25* 1.26* 1.35*  CALCIUM 8.9 9.1 9.1 9.4 9.6 9.5  MG 2.3  --   --   --   --   --    GFR: Estimated Creatinine Clearance: 23 mL/min (A) (by C-G formula based on SCr of 1.35 mg/dL (H)). Liver Function Tests: Recent Labs  Lab 04/08/20 1458 04/14/20 0643  AST 23 26  ALT 11 27  ALKPHOS 123 77  BILITOT 3.8* 1.7*  PROT 6.5 5.3*  ALBUMIN 3.6 2.8*   No results for input(s): LIPASE, AMYLASE in the last 168 hours. No results for input(s): AMMONIA in the last 168 hours. Coagulation Profile: No results for input(s): INR, PROTIME in the last 168 hours. Cardiac Enzymes: No results  for input(s): CKTOTAL, CKMB, CKMBINDEX, TROPONINI in the last 168 hours. BNP (last 3 results) No results for input(s): PROBNP in the last 8760 hours. HbA1C: No results for input(s): HGBA1C in the last 72 hours. CBG: No results for input(s): GLUCAP in the last 168 hours. Lipid Profile: No results for input(s): CHOL, HDL, LDLCALC, TRIG, CHOLHDL, LDLDIRECT in the last 72 hours. Thyroid Function Tests: No results for input(s): TSH, T4TOTAL, FREET4, T3FREE, THYROIDAB in the last 72 hours. Anemia Panel: Recent Labs    04/13/20 0820 04/14/20 0643  FERRITIN 29 28   Sepsis Labs: Recent Labs  Lab 04/09/20 0723 04/10/20 0536 04/11/20 0506  PROCALCITON <0.10 <0.10 <0.10    Recent Results (from the past 240 hour(s))  SARS CORONAVIRUS 2 (TAT 6-24 HRS) Nasopharyngeal Nasopharyngeal Swab     Status: None   Collection Time: 04/08/20  7:28 PM   Specimen: Nasopharyngeal Swab  Result Value Ref Range Status   SARS Coronavirus 2 NEGATIVE NEGATIVE Final    Comment: (NOTE) SARS-CoV-2 target nucleic acids are NOT DETECTED.  The SARS-CoV-2 RNA is generally detectable in upper and lower respiratory specimens during the acute phase of infection. Negative results do not preclude SARS-CoV-2 infection, do not rule out co-infections with other pathogens, and should not be used as the sole basis for treatment or other patient management decisions. Negative results must be combined with clinical observations, patient history, and epidemiological information. The expected result is Negative.  Fact Sheet for Patients: HairSlick.no  Fact Sheet for Healthcare Providers: quierodirigir.com  This test is not yet approved or cleared by the [place] FDA and  has been authorized for detection and/or diagnosis of SARS-CoV-2 by FDA under an Emergency Use Authorization (EUA). This EUA will remain  in effect (meaning this test can be used)  for the  duration of the COVID-19 declaration under Se ction 564(b)(1) of the Act, 21 U.S.C. section 360bbb-3(b)(1), unless the authorization is terminated or revoked sooner.  Performed at [facility], [address], [place], [place] [address]   SARS CORONAVIRUS 2 (TAT 6-24 HRS) Nasopharyngeal Nasopharyngeal Swab     Status: Abnormal   Collection Time: 04/12/20 11:32 AM   Specimen: Nasopharyngeal Swab  Result Value Ref Range Status   SARS Coronavirus 2 POSITIVE (A) NEGATIVE Final    Comment: (NOTE) SARS-CoV-2 target nucleic acids are DETECTED.  The SARS-CoV-2 RNA is generally detectable in upper and lower respiratory specimens during the acute phase of infection. Positive results are indicative of the presence of SARS-CoV-2 RNA. Clinical correlation with patient history and other diagnostic information is  necessary to determine patient infection status. Positive results do not rule out bacterial infection or co-infection with other viruses.  The expected result is Negative.  Fact Sheet for Patients: HairSlick.no  Fact Sheet for Healthcare Providers: quierodirigir.com  This test is not yet approved or cleared by the [place] FDA and  has been authorized for detection and/or diagnosis of SARS-CoV-2 by FDA under an Emergency Use Authorization (EUA). This EUA will remain  in effect (meaning this test can be used) for the duration of the COVID-19 declaration under Section 564(b)(1) of the Act, 21 U. S.C. section 360bbb-3(b)(1), unless the authorization is terminated or revoked sooner.   Performed at [facility], [address], [place], [place] [address]       Imaging Studies   No results found.   Medications   Scheduled Meds: . vitamin C  500 mg Oral Daily  . atorvastatin  20 mg Oral 1 day or 1 dose  . brimonidine  1 drop Both Eyes Q8H  . enoxaparin (LOVENOX) injection  40 mg Subcutaneous Q24H  .  ferrous sulfate  325 mg Oral BID WC  . fluticasone furoate-vilanterol  1 puff Inhalation Daily  . hydrocerin   Topical Daily  . latanoprost  1 drop Both Eyes QHS  . methylPREDNISolone (SOLU-MEDROL) injection  40 mg Intravenous BID  . metoprolol succinate  12.5 mg Oral QHS  . nicotine  21 mg Transdermal Daily  . sertraline  25 mg Oral Daily  . sodium chloride flush  3 mL Intravenous Q12H  . sodium zirconium cyclosilicate  10 g Oral Once  . timolol  1 drop Both Eyes BID  . tiotropium  18 mcg Inhalation Daily  . torsemide  20 mg Oral Daily  . zinc sulfate  220 mg Oral Daily   Continuous Infusions: . sodium chloride    . remdesivir 100 mg in NS 100 mL 100 mg (04/14/20 0944)       LOS: 6 days    Time spent: 30 minutes    Pennie Banter, DO [facility]  04/14/2020, 2:24 PM    If 7PM-7AM, please contact night-coverage. How to contact the [facility] Attending or Consulting provider 7A - 7P or covering provider during after hours 7P -7A, for this patient?    1. Check the care team in [facility] and look for a) attending/consulting [facility] provider listed and b) the [facility] team listed 2. Log into [email or web address] and use [facility] universal password to access. If you do not have the password, please contact the hospital operator. 3. Locate the [facility] provider you are looking for under [facility] and page to a number that you can be directly reached. 4. If  you still have difficulty reaching the provider, please page the [facility] (Director on Call) for the Hospitalists listed on amion for assistance.

## 2020-04-15 DIAGNOSIS — U071 COVID-19: Secondary | ICD-10-CM | POA: Diagnosis not present

## 2020-04-15 DIAGNOSIS — J441 Chronic obstructive pulmonary disease with (acute) exacerbation: Secondary | ICD-10-CM | POA: Diagnosis not present

## 2020-04-15 LAB — COMPREHENSIVE METABOLIC PANEL
ALT: 29 U/L (ref 0–44)
AST: 24 U/L (ref 15–41)
Albumin: 2.8 g/dL — ABNORMAL LOW (ref 3.5–5.0)
Alkaline Phosphatase: 75 U/L (ref 38–126)
Anion gap: 7 (ref 5–15)
BUN: 60 mg/dL — ABNORMAL HIGH (ref 8–23)
CO2: 35 mmol/L — ABNORMAL HIGH (ref 22–32)
Calcium: 9.4 mg/dL (ref 8.9–10.3)
Chloride: 97 mmol/L — ABNORMAL LOW (ref 98–111)
Creatinine, Ser: 1.25 mg/dL — ABNORMAL HIGH (ref 0.61–1.24)
GFR, Estimated: 57 mL/min — ABNORMAL LOW (ref >60)
Glucose, Bld: 150 mg/dL — ABNORMAL HIGH (ref 70–99)
Potassium: 5 mmol/L (ref 3.5–5.1)
Sodium: 139 mmol/L (ref 135–145)
Total Bilirubin: 1.8 mg/dL — ABNORMAL HIGH (ref 0.3–1.2)
Total Protein: 5.4 g/dL — ABNORMAL LOW (ref 6.5–8.1)

## 2020-04-15 LAB — C-REACTIVE PROTEIN: CRP: 0.6 mg/dL (ref <1.0)

## 2020-04-15 LAB — CBC
HCT: 31.3 % — ABNORMAL LOW (ref 39.0–52.0)
Hemoglobin: 9.9 g/dL — ABNORMAL LOW (ref 13.0–17.0)
MCH: 29.2 pg (ref 26.0–34.0)
MCHC: 31.6 g/dL (ref 30.0–36.0)
MCV: 92.3 fL (ref 80.0–100.0)
Platelets: 164 10*3/uL (ref 150–400)
RBC: 3.39 MIL/uL — ABNORMAL LOW (ref 4.22–5.81)
RDW: 15.4 % (ref 11.5–15.5)
WBC: 3.1 10*3/uL — ABNORMAL LOW (ref 4.0–10.5)
nRBC: 0 % (ref 0.0–0.2)

## 2020-04-15 LAB — FIBRIN DERIVATIVES D-DIMER (ARMC ONLY): Fibrin derivatives D-dimer (ARMC): 549.79 ng/mL (FEU) — ABNORMAL HIGH (ref 0.00–499.00)

## 2020-04-15 LAB — FERRITIN: Ferritin: 35 ng/mL (ref 24–336)

## 2020-04-15 MED ORDER — PREDNISONE 20 MG PO TABS
40.0000 mg | ORAL_TABLET | Freq: Every day | ORAL | Status: DC
Start: 2020-04-16 — End: 2020-04-18
  Administered 2020-04-16 – 2020-04-18 (×3): 40 mg via ORAL
  Filled 2020-04-15 (×3): qty 2

## 2020-04-15 MED ORDER — DM-GUAIFENESIN ER 30-600 MG PO TB12
1.0000 | ORAL_TABLET | Freq: Two times a day (BID) | ORAL | Status: DC
  Administered 2020-04-15 – 2020-04-22 (×15): 1 via ORAL
  Filled 2020-04-15 (×15): qty 1

## 2020-04-15 MED ORDER — ENOXAPARIN SODIUM 100 MG/ML ~~LOC~~ SOLN
0.5000 mg/kg | SUBCUTANEOUS | Status: DC
  Administered 2020-04-15 – 2020-04-21 (×7): 85 mg via SUBCUTANEOUS
  Filled 2020-04-15 (×8): qty 1

## 2020-04-15 MED ORDER — HYDROCOD POLST-CPM POLST ER 10-8 MG/5ML PO SUER
5.0000 mL | Freq: Two times a day (BID) | ORAL | Status: DC | PRN
  Administered 2020-04-15 – 2020-04-18 (×3): 5 mL via ORAL
  Filled 2020-04-15 (×3): qty 5

## 2020-04-15 NOTE — Progress Notes (Signed)
Physical Therapy Treatment Patient Details Name: Corey Wong MRN: [id number] DOB: 1934/09/12 Today's Date: 04/15/2020    History of Present Illness Corey Wong is a 86 y.o. male with medical history significant of chronic diastolic CHF, paroxysmal A. fib with history of refusing systemic anticoagulation, COPD, cor pulmonale, HTN, IDDM, morbid obesity, macular degeneration, left-sided blindness, sleep apnea diagnosed in 2014 refuses CPAP, presented with increasing short of breath and leg swelling. Pt found to be (+) c COVID19 when tested in preparation for DC to facility.    PT Comments    Pt in bed upon entry, agreeable to participate. Pt reports substantially more fatigue than when last seen, also reports not sleeping well at night. Pt assisted with cranial scooting in bed as he had slid caudally and now uncomfortable. Be noted to be saturated with urine when moving, RN made aware. Pt assisted with leg exercises in bed, appears to have improve AA/ROM of leg since prior visits, but chronic ROM limitations maintained. Pt satting at 88-89% on room air, author washes [place] with soap and water then dons on patient at 2L/min. Session ended once NAx2 arrives to assist with linen changes.    Follow Up Recommendations  SNF;Supervision - Intermittent     Equipment Recommendations       Recommendations for Other Services       Precautions / Restrictions Precautions Precautions: Fall Restrictions Weight Bearing Restrictions: No Other Position/Activity Restrictions: chronic arthritic right kne; severe bilat DJD of shoulders.    Mobility  Bed Mobility Overal bed mobility: Needs Assistance   Rolling: Max assist;+2 for safety/equipment         General bed mobility comments: total A for scooting up in bed; all others deferred due to bed being saturated with urine (purewick failure)  Transfers                    Ambulation/Gait                 Stairs              Wheelchair Mobility    Modified Rankin (Stroke Patients Only)       Balance                                            Cognition Arousal/Alertness: Awake/alert Behavior During Therapy: WFL for tasks assessed/performed Overall Cognitive Status: Within Functional Limits for tasks assessed                                 General Comments: recognized author from 2 days prior, remembers authors name during session      Exercises General Exercises - Lower Extremity Short Arc Quad: AAROM;Strengthening;Both;Supine;15 reps Heel Slides: 10 reps;20 reps;AAROM;Both;Limitations Heel Slides Limitations: 2x15 bilat    General Comments        Pertinent Vitals/Pain Pain Assessment: Faces Faces Pain Scale: Hurts even more (generally in pain with coughing)    Home Living                      Prior Function            PT Goals (current goals can now be found in the care plan section) Acute Rehab PT Goals Patient Stated Goal: to go home PT Goal Formulation:  With patient Time For Goal Achievement: 04/23/20 Potential to Achieve Goals: Poor    Frequency    Min 2X/week      PT Plan      Co-evaluation              AM-PAC PT "6 Clicks" Mobility   Outcome Measure  Help needed turning from your back to your side while in a flat bed without using bedrails?: Total Help needed moving from lying on your back to sitting on the side of a flat bed without using bedrails?: Total Help needed moving to and from a bed to a chair (including a wheelchair)?: Total Help needed standing up from a chair using your arms (e.g., wheelchair or bedside chair)?: Total Help needed to walk in hospital room?: Total Help needed climbing 3-5 steps with a railing? : Total 6 Click Score: 6    End of Session Equipment Utilized During Treatment: Oxygen Activity Tolerance: Patient limited by fatigue;Patient tolerated treatment well Patient left: in  bed;with call bell/phone within reach;with nursing/sitter in room Nurse Communication: Mobility status (bed wet; pt coughing continuously) PT Visit Diagnosis: Other abnormalities of gait and mobility (R26.89);Muscle weakness (generalized) (M62.81);Difficulty in walking, not elsewhere classified (R26.2);Pain Pain - Right/Left: Right Pain - part of body: Shoulder     Time: 1130-1145 PT Time Calculation (min) (ACUTE ONLY): 15 min  Charges:  $Therapeutic Exercise: 8-22 mins                     1:22 PM, 04/15/20 Rosamaria Lints, PT, DPT Physical Therapist - [facility]  [phone] (ASCOM)    Breyona Swander C 04/15/2020, 1:19 PM

## 2020-04-15 NOTE — Progress Notes (Addendum)
PROGRESS NOTE    Corey Wong   WYO:[id number]  DOB: [birth year]  PCP: Marina Goodell, MD    DOA: 04/08/2020 LOS: 7   Brief Narrative   Corey Wong is a 86 y.o. male with medical history significant of chronic diastolic CHF, paroxysmal A. fib with history of refusing systemic anticoagulation, COPD, cor pulmonale, HTN, IDDM, morbid obesity, macular degeneration, left-sided blindness, sleep apnea diagnosed in 2014 refuses CPAP, presented with increasing short of breath, generalized weakness and leg swelling.  Patient was initially admitted for management of acute CHF decompensation.  Discharge to short-term rehab was being planned and patient had to be screened for COVID-19 which resulted positive.     Assessment & Plan   Active Problems:   Acute diastolic CHF (congestive heart failure) (HCC)   COPD with acute exacerbation (HCC)   Acute CHF (congestive heart failure) (HCC)   CHF (congestive heart failure) (HCC)   Insomnia   Chronic a-fib (HCC)   Obesity (BMI 30-39.9)   Anxiety and depression   Anasarca   Acquired thrombophilia (HCC)   Acute on chronic respiratory failure with hypoxia and hypercapnia (HCC)   COVID-19 virus infection   Acute on chronic respiratory failure with hypoxia -due to COVID-19 and COPD.  BiPAP was tried on 2 nights but not tolerated.  Continue supplemental oxygen at night and as needed during the day overnight oximetry with quite a bit of time with SPO2 in the 80s. --Continue supplemental oxygen as needed to maintain O2 sat greater than 88%, wean as tolerated  COVID-19 infection with COPD exacerbation -patient tested negative for COVID on 1/6, subsequently positive when screened for SNF placement. -- Continue remdesivir, steroids, zinc, vitamin C, inhalers per orders -- Mucinex scheduled, Tussionex as needed  Acute on chronic diastolic CHF with anasarca - Net IO Since Admission: -6,007 mL [04/15/20 1633] Has been transitioned from IV diuresis  back to torsemide. --Continue Toprol and torsemide -- Strict I/O's and daily weights -- Leg wraps for edema  Acute normocytic anemia -not present on admission.  Hemoglobin was initially 13.6, dropped to 9.6 on 1/11.  No evidence of bleeding.  Follow-up stool guaiac, but stools reported as brown.  No reports of melena or hematochezia.  LDH is slightly low. --Continue holding aspirin.   --Hemoglobin stable today, okay to continue Lovenox for now.   -- Monitor CBC  Hyperkalemia -K5.3 on 1/12.  10 g Lokelma given.  Monitor BMP.  AKI -due to diuresis.  Creatinine on 1/7 was 0.99, rose to 1.35 >>1.25 today.  Monitor BMP.  Generalized weakness -SNF recommended by therapy for short-term rehab.  Accepted at [facility] and still wants to go there however will not be accepted until after 10-day isolation. --Continue therapy here is much as possible.  Chronic A. Fib -rate controlled.  Continue metoprolol.  Not on anticoagulation.  Aspirin currently held due to anemia.  Acquired thrombophilia secondary to A. Fib -patient is at higher risk of stroke as he is not on anticoagulation for A. Fib.  Right shoulder pain -improved.  PT and OT eval's.  Depression/anxiety -continue Zoloft  Hyperlipidemia -continue Lipitor  Tobacco abuse -counseled on importance of cessation.  Nicotine patch ordered.  Insomnia -continue trazodone      DVT prophylaxis:    Diet:  Diet Orders (From admission, onward)    Start     Ordered   04/15/20 1158  Diet Heart Room service appropriate? Yes; Fluid consistency: Thin; Fluid restriction: 1800 mL Fluid  Diet effective  now       Comments: Please send finger foods, like sandwiches for lunch, etc. Pt able to feed himself these items  Question Answer Comment  Room service appropriate? Yes   Fluid consistency: Thin   Fluid restriction: 1800 mL Fluid      04/15/20 1158            Code Status: Full Code    Subjective 04/15/20    Patient seen today at bedside.  He  continues report coughing.  Says he would like to have assistance with feeding himself due to his blindness, is able to feed himself at home however.  Denies any shortness of breath, chest pain, fevers chills, nausea vomiting or other complaints.   Disposition Plan & Communication   Status is: Inpatient  Remains inpatient appropriate because:IV treatments appropriate due to intensity of illness or inability to take PO.  Currently with AKI and acute anemia requiring further monitoring and evaluation.  Unable to DC to SNF until 10-day isolation period for COVID.  Unsafe to return home.   Dispo: The patient is from: Home              Anticipated d/c is to: SNF              Anticipated d/c date is: 04/22/2020              Patient currently is not medically stable to d/c.   Family Communication: None at bedside, will attempt to call.   Consults, Procedures, Significant Events   Consultants:   None  Procedures:   None  Antimicrobials:  Anti-infectives (From admission, onward)   Start     Dose/Rate Route Frequency Ordered Stop   04/14/20 1000  remdesivir 100 mg in sodium chloride 0.9 % 100 mL IVPB       "Followed by" Linked Group Details   100 mg 200 mL/hr over 30 Minutes Intravenous Daily 04/13/20 1004 04/18/20 0959   04/13/20 1130  remdesivir 200 mg in sodium chloride 0.9% 250 mL IVPB       "Followed by" Linked Group Details   200 mg 580 mL/hr over 30 Minutes Intravenous Once 04/13/20 1004 04/13/20 1206   04/08/20 2200  ciprofloxacin (CIPRO) tablet 500 mg  Status:  Discontinued        500 mg Oral 2 times daily 04/08/20 2048 04/09/20 1131         Objective   Vitals:   04/15/20 1048 04/15/20 1135 04/15/20 1146 04/15/20 1251  BP: 98/80   118/73  Pulse: 64   81  Resp: 20   18  Temp: 98.6 F (37 C)   98.3 F (36.8 C)  TempSrc:      SpO2: 92% (!) 88% (!) 89% 91%  Weight:      Height:        Intake/Output Summary (Last 24 hours) at 04/15/2020 1633 Last data filed at  04/15/2020 1100 Gross per 24 hour  Intake --  Output 1495 ml  Net -1495 ml   Filed Weights   04/13/20 0440 04/15/20 0533  Weight: 39.9 kg (!) 169.3 kg    Physical Exam:  General exam: awake, alert, no acute distress Respiratory system: Symmetric chest rise, normal respiratory effort, on 2 L/min nasal cannula oxygen. Cardiovascular system: normal S1/S2, RRR, no pedal edema.   Gastrointestinal system: soft, NT, ND Central nervous system: no gross focal neurologic deficits, normal speech Extremities: moves all, no edema, normal tone   Labs   Data  Reviewed: I have personally reviewed following labs and imaging studies  CBC: Recent Labs  Lab 04/13/20 0820 04/13/20 1307 04/14/20 0643 04/15/20 0541  WBC 4.4  --  5.0 3.1*  HGB 9.6* 10.4* 9.5* 9.9*  HCT 29.8*  --  29.7* 31.3*  MCV 91.7  --  91.4 92.3  PLT 164  --  180 164   Basic Metabolic Panel: Recent Labs  Lab 04/09/20 0723 04/10/20 0536 04/11/20 0506 04/12/20 0258 04/13/20 0820 04/14/20 0643 04/15/20 0541  NA 138   < > 139 139 137 140 139  K 3.8   < > 4.3 4.8 5.2* 5.3* 5.0  CL 100   < > 99 100 95* 99 97*  CO2 31   < > 33* 33* 32 34* 35*  GLUCOSE 94   < > 121* 167* 155* 146* 150*  BUN 15   < > 26* 34* 46* 54* 60*  CREATININE 0.99   < > 1.31* 1.25* 1.26* 1.35* 1.25*  CALCIUM 8.9   < > 9.1 9.4 9.6 9.5 9.4  MG 2.3  --   --   --   --   --   --    < > = values in this interval not displayed.   GFR: Estimated Creatinine Clearance: 67.7 mL/min (A) (by C-G formula based on SCr of 1.25 mg/dL (H)). Liver Function Tests: Recent Labs  Lab 04/14/20 0643 04/15/20 0541  AST 26 24  ALT 27 29  ALKPHOS 77 75  BILITOT 1.7* 1.8*  PROT 5.3* 5.4*  ALBUMIN 2.8* 2.8*   No results for input(s): LIPASE, AMYLASE in the last 168 hours. No results for input(s): AMMONIA in the last 168 hours. Coagulation Profile: No results for input(s): INR, PROTIME in the last 168 hours. Cardiac Enzymes: No results for input(s): CKTOTAL,  CKMB, CKMBINDEX, TROPONINI in the last 168 hours. BNP (last 3 results) No results for input(s): PROBNP in the last 8760 hours. HbA1C: No results for input(s): HGBA1C in the last 72 hours. CBG: No results for input(s): GLUCAP in the last 168 hours. Lipid Profile: No results for input(s): CHOL, HDL, LDLCALC, TRIG, CHOLHDL, LDLDIRECT in the last 72 hours. Thyroid Function Tests: No results for input(s): TSH, T4TOTAL, FREET4, T3FREE, THYROIDAB in the last 72 hours. Anemia Panel: Recent Labs    04/14/20 0643 04/15/20 0541  FERRITIN 28 35   Sepsis Labs: Recent Labs  Lab 04/09/20 0723 04/10/20 0536 04/11/20 0506  PROCALCITON <0.10 <0.10 <0.10    Recent Results (from the past 240 hour(s))  SARS CORONAVIRUS 2 (TAT 6-24 HRS) Nasopharyngeal Nasopharyngeal Swab     Status: None   Collection Time: 04/08/20  7:28 PM   Specimen: Nasopharyngeal Swab  Result Value Ref Range Status   SARS Coronavirus 2 NEGATIVE NEGATIVE Final    Comment: (NOTE) SARS-CoV-2 target nucleic acids are NOT DETECTED.  The SARS-CoV-2 RNA is generally detectable in upper and lower respiratory specimens during the acute phase of infection. Negative results do not preclude SARS-CoV-2 infection, do not rule out co-infections with other pathogens, and should not be used as the sole basis for treatment or other patient management decisions. Negative results must be combined with clinical observations, patient history, and epidemiological information. The expected result is Negative.  Fact Sheet for Patients: HairSlick.nohttps://www.fda.gov/media/138098/download  Fact Sheet for Healthcare Providers: quierodirigir.comhttps://www.fda.gov/media/138095/download  This test is not yet approved or cleared by the [place] FDA and  has been authorized for detection and/or diagnosis of SARS-CoV-2 by FDA under an Emergency Use  Authorization (EUA). This EUA will remain  in effect (meaning this test can be used) for the duration of the COVID-19  declaration under Se ction 564(b)(1) of the Act, 21 U.S.C. section 360bbb-3(b)(1), unless the authorization is terminated or revoked sooner.  Performed at [facility], [address], [place], [place] [address]   SARS CORONAVIRUS 2 (TAT 6-24 HRS) Nasopharyngeal Nasopharyngeal Swab     Status: Abnormal   Collection Time: 04/12/20 11:32 AM   Specimen: Nasopharyngeal Swab  Result Value Ref Range Status   SARS Coronavirus 2 POSITIVE (A) NEGATIVE Final    Comment: (NOTE) SARS-CoV-2 target nucleic acids are DETECTED.  The SARS-CoV-2 RNA is generally detectable in upper and lower respiratory specimens during the acute phase of infection. Positive results are indicative of the presence of SARS-CoV-2 RNA. Clinical correlation with patient history and other diagnostic information is  necessary to determine patient infection status. Positive results do not rule out bacterial infection or co-infection with other viruses.  The expected result is Negative.  Fact Sheet for Patients: HairSlick.no  Fact Sheet for Healthcare Providers: quierodirigir.com  This test is not yet approved or cleared by the [place] FDA and  has been authorized for detection and/or diagnosis of SARS-CoV-2 by FDA under an Emergency Use Authorization (EUA). This EUA will remain  in effect (meaning this test can be used) for the duration of the COVID-19 declaration under Section 564(b)(1) of the Act, 21 U. S.C. section 360bbb-3(b)(1), unless the authorization is terminated or revoked sooner.   Performed at [facility], [address] [place], [place] [address]       Imaging Studies   No results found.   Medications   Scheduled Meds: . vitamin C  500 mg Oral Daily  . atorvastatin  20 mg Oral 1 day or 1 dose  . brimonidine  1 drop Both Eyes Q8H  . dextromethorphan-guaiFENesin  1 tablet Oral BID  . enoxaparin (LOVENOX) injection  0.5  mg/kg Subcutaneous Q24H  . ferrous sulfate  325 mg Oral BID WC  . fluticasone furoate-vilanterol  1 puff Inhalation Daily  . hydrocerin   Topical Daily  . latanoprost  1 drop Both Eyes QHS  . methylPREDNISolone (SOLU-MEDROL) injection  40 mg Intravenous BID  . metoprolol succinate  12.5 mg Oral QHS  . nicotine  21 mg Transdermal Daily  . sertraline  25 mg Oral Daily  . sodium chloride flush  3 mL Intravenous Q12H  . sodium zirconium cyclosilicate  10 g Oral Once  . timolol  1 drop Both Eyes BID  . tiotropium  18 mcg Inhalation Daily  . torsemide  20 mg Oral Daily  . zinc sulfate  220 mg Oral Daily   Continuous Infusions: . sodium chloride    . remdesivir 100 mg in NS 100 mL 100 mg (04/15/20 0923)       LOS: 7 days    Time spent: 25 minutes with greater than 50% spent at bedside and in coordination of care    Pennie Banter, DO [facility]  04/15/2020, 4:33 PM    If 7PM-7AM, please contact night-coverage. How to contact the [facility] Attending or Consulting provider 7A - 7P or covering provider during after hours 7P -7A, for this patient?    1. Check the care team in [facility] and look for a) attending/consulting [facility] provider listed and b) the [facility] team listed 2. Log into [email or web address] and use [facility]'s universal password to access. If you  do not have the password, please contact the hospital operator. 3. Locate the [facility] provider you are looking for under [facility] and page to a number that you can be directly reached. 4. If you still have difficulty reaching the provider, please page the [facility] (Director on Call) for the Hospitalists listed on amion for assistance.

## 2020-04-16 DIAGNOSIS — J441 Chronic obstructive pulmonary disease with (acute) exacerbation: Secondary | ICD-10-CM | POA: Diagnosis not present

## 2020-04-16 DIAGNOSIS — I482 Chronic atrial fibrillation, unspecified: Secondary | ICD-10-CM | POA: Diagnosis not present

## 2020-04-16 DIAGNOSIS — U071 COVID-19: Secondary | ICD-10-CM | POA: Diagnosis not present

## 2020-04-16 DIAGNOSIS — J9621 Acute and chronic respiratory failure with hypoxia: Secondary | ICD-10-CM | POA: Diagnosis not present

## 2020-04-16 LAB — COMPREHENSIVE METABOLIC PANEL
ALT: 37 U/L (ref 0–44)
AST: 27 U/L (ref 15–41)
Albumin: 2.8 g/dL — ABNORMAL LOW (ref 3.5–5.0)
Alkaline Phosphatase: 73 U/L (ref 38–126)
Anion gap: 6 (ref 5–15)
BUN: 62 mg/dL — ABNORMAL HIGH (ref 8–23)
CO2: 37 mmol/L — ABNORMAL HIGH (ref 22–32)
Calcium: 9.2 mg/dL (ref 8.9–10.3)
Chloride: 101 mmol/L (ref 98–111)
Creatinine, Ser: 1.38 mg/dL — ABNORMAL HIGH (ref 0.61–1.24)
GFR, Estimated: 50 mL/min — ABNORMAL LOW (ref >60)
Glucose, Bld: 134 mg/dL — ABNORMAL HIGH (ref 70–99)
Potassium: 4.3 mmol/L (ref 3.5–5.1)
Sodium: 144 mmol/L (ref 135–145)
Total Bilirubin: 1.7 mg/dL — ABNORMAL HIGH (ref 0.3–1.2)
Total Protein: 5.1 g/dL — ABNORMAL LOW (ref 6.5–8.1)

## 2020-04-16 LAB — CBC
HCT: 31.2 % — ABNORMAL LOW (ref 39.0–52.0)
Hemoglobin: 10.1 g/dL — ABNORMAL LOW (ref 13.0–17.0)
MCH: 30 pg (ref 26.0–34.0)
MCHC: 32.4 g/dL (ref 30.0–36.0)
MCV: 92.6 fL (ref 80.0–100.0)
Platelets: 167 10*3/uL (ref 150–400)
RBC: 3.37 MIL/uL — ABNORMAL LOW (ref 4.22–5.81)
RDW: 15.3 % (ref 11.5–15.5)
WBC: 3.4 10*3/uL — ABNORMAL LOW (ref 4.0–10.5)
nRBC: 0 % (ref 0.0–0.2)

## 2020-04-16 LAB — FIBRIN DERIVATIVES D-DIMER (ARMC ONLY): Fibrin derivatives D-dimer (ARMC): 530.37 ng/mL (FEU) — ABNORMAL HIGH (ref 0.00–499.00)

## 2020-04-16 LAB — C-REACTIVE PROTEIN: CRP: 0.6 mg/dL (ref <1.0)

## 2020-04-16 LAB — FERRITIN: Ferritin: 28 ng/mL (ref 24–336)

## 2020-04-16 NOTE — Progress Notes (Signed)
Occupational Therapy Treatment Patient Details Name: Corey Wong MRN: [id number] DOB: 11-26-1934 Today's Date: 04/16/2020    History of present illness Corey Wong is a 86 y.o. male with medical history significant of chronic diastolic CHF, paroxysmal A. fib with history of refusing systemic anticoagulation, COPD, cor pulmonale, HTN, IDDM, morbid obesity, macular degeneration, left-sided blindness, sleep apnea diagnosed in 2014 refuses CPAP, presented with increasing short of breath and leg swelling. Pt found to be (+) c COVID19 when tested in preparation for DC to facility.   OT comments  Pt seen for limited OT tx session. When attempting grooming task at bed level, pt reporting inability to wash his face 2/2 bilat shoulder ROM issues and ultimately requiring total assist to complete. Pt appreciative, as he had dried food in his beard. Pt endorsing discomfort from being in the same position in bed. Pt noted to be in A-fib, with HR dropping to 39, 40's, and 50's. RN notified. Attempted to get nursing staff to assist but unsuccessful during session. RN/NT notified again after session and after checking back in on pt, he was noted to be slid up in bed.    Follow Up Recommendations  SNF    Equipment Recommendations  3 in 1 bedside commode    Recommendations for Other Services      Precautions / Restrictions Precautions Precautions: Fall Restrictions Weight Bearing Restrictions: No Other Position/Activity Restrictions: chronic arthritic right kne; severe bilat DJD of shoulders.       Mobility Bed Mobility Overal bed mobility: Needs Assistance             General bed mobility comments: +2 to scoot up in bed with nursing after session (unable to locate nursing staff to assist during session  Transfers                      Balance                                           ADL either performed or assessed with clinical judgement   ADL Overall ADL's  : Needs assistance/impaired                                       General ADL Comments: TOTAL A for washing face, pt reports unable to reach his face 2/2 limited bilat shoulder ROM     Vision Patient Visual Report: No change from baseline     Perception     Praxis      Cognition Arousal/Alertness: Lethargic Behavior During Therapy: WFL for tasks assessed/performed Overall Cognitive Status: Within Functional Limits for tasks assessed                                 General Comments: Pt reports being very tired, eyes closing occasionally requiring cues to improve alertness        Exercises     Shoulder Instructions       General Comments      Pertinent Vitals/ Pain       Pain Assessment: Faces Faces Pain Scale: Hurts little more Pain Location: endorses "butt" pain 2/2 bed positioning Pain Descriptors / Indicators: Aching Pain Intervention(s): Limited activity within patient's tolerance;Monitored during session (  attempted to find RN and NT to assist with +2 to scoot him up, eventually found after session)  Home Living                                          Prior Functioning/Environment              Frequency  Min 1X/week        Progress Toward Goals  OT Goals(current goals can now be found in the care plan section)  Progress towards OT goals: OT to reassess next treatment  Acute Rehab OT Goals Patient Stated Goal: to go home OT Goal Formulation: With patient Time For Goal Achievement: 04/23/20 Potential to Achieve Goals: Fair  Plan Discharge plan remains appropriate;Frequency remains appropriate    Co-evaluation                 AM-PAC OT "6 Clicks" Daily Activity     Outcome Measure   Help from another person eating meals?: A Lot Help from another person taking care of personal grooming?: Total Help from another person toileting, which includes using toliet, bedpan, or urinal?: A  Lot Help from another person bathing (including washing, rinsing, drying)?: A Lot Help from another person to put on and taking off regular upper body clothing?: A Lot Help from another person to put on and taking off regular lower body clothing?: A Lot 6 Click Score: 11    End of Session Equipment Utilized During Treatment: Oxygen  OT Visit Diagnosis: Other abnormalities of gait and mobility (R26.89)   Activity Tolerance Patient limited by fatigue   Patient Left in bed;with call bell/phone within reach;with bed alarm set   Nurse Communication Other (comment) (need for +2 assist for scooting up in bed for improved positioning/pressure relief)        Time: 8676-7209 OT Time Calculation (min): 9 min  Charges: OT General Charges $OT Visit: 1 Visit OT Treatments $Self Care/Home Management : 8-22 mins  Richrd Prime, MPH, MS, OTR/L ascom [phone] 04/16/20, 12:00 PM

## 2020-04-16 NOTE — Progress Notes (Addendum)
PROGRESS NOTE    [name]   WUX:[id number]  DOB: [birth year]  PCP: Marina Goodell, MD    DOA: 04/08/2020 LOS: 8   Brief Narrative   Corey Wong is a 86 y.o. male with medical history significant of chronic diastolic CHF, paroxysmal A. fib with history of refusing systemic anticoagulation, COPD, cor pulmonale, HTN, IDDM, morbid obesity, macular degeneration, left-sided blindness, sleep apnea diagnosed in 2014 refuses CPAP, presented with increasing short of breath, generalized weakness and leg swelling.  Patient was initially admitted for management of acute CHF decompensation.  Discharge to short-term rehab was being planned and patient had to be screened for COVID-19 which resulted positive.     Assessment & Plan   Active Problems:   Acute diastolic CHF (congestive heart failure) (HCC)   COPD with acute exacerbation (HCC)   Acute CHF (congestive heart failure) (HCC)   CHF (congestive heart failure) (HCC)   Insomnia   Chronic a-fib (HCC)   Obesity (BMI 30-39.9)   Anxiety and depression   Anasarca   Acquired thrombophilia (HCC)   Acute on chronic respiratory failure with hypoxia and hypercapnia (HCC)   COVID-19 virus infection   Acute on chronic respiratory failure with hypoxia -due to COVID-19 and COPD.  BiPAP was tried on 2 nights but not tolerated.  Continue supplemental oxygen at night and as needed during the day overnight oximetry with quite a bit of time with SPO2 in the 80s. --Continue supplemental oxygen as needed to maintain O2 sat greater than 88%, wean as tolerated  COVID-19 infection with COPD exacerbation -patient tested negative for COVID on 1/6, subsequently positive when screened for SNF placement. -- Continue remdesivir, steroids, zinc, vitamin C, inhalers per orders -- Mucinex scheduled, Tussionex as needed  Acute on chronic diastolic CHF with anasarca - Net IO Since Admission: -7,397 mL [04/16/20 1557] Has been transitioned from IV diuresis  back to torsemide.  Torsemide on hold for persistent AKI.  Patient is clinically dry and with contraction alkalosis. --Continue Toprol -- Hold torsemide due to mildly worsening renal function -- Strict I/O's and daily weights -- Leg wraps for edema  Acute normocytic anemia -not present on admission.  Hemoglobin was initially 13.6, dropped to 9.6 on 1/11.  No evidence of bleeding.  Follow-up stool guaiac, but stools reported as brown.  No reports of melena or hematochezia.  LDH is slightly low. --Continue holding aspirin, resume in next day or two if hemoglobin stable after resume Lovenox.   --Hemoglobin stable past few days --Resume Lovenox for VTE prophylaxis -- Monitor CBC  Hyperkalemia -K5.3 on 1/12.  10 g Lokelma given.  Monitor BMP.  AKI -due to diuresis.  Creatinine on 1/7 was 0.99, rose to 1.35 >>1.25...Marland Kitchen1.38.  Monitor BMP. Hold torsemide.  Generalized weakness -SNF recommended by therapy for short-term rehab.  Accepted at [facility] and still wants to go there however will not be accepted until after 10-day isolation. --Continue therapy here is much as possible.  Chronic A. Fib -rate controlled.  Continue metoprolol.  Not on anticoagulation.  Aspirin currently held due to anemia.  Acquired thrombophilia secondary to A. Fib -patient is at higher risk of stroke as he is not on anticoagulation for A. Fib.  Right shoulder pain -improved.  PT and OT.  Left upper extremity and elbow swelling - chronic and at baseline per patient, not tender or warm on exam.  Chart review confirms this is chronic.  Pt with hx of L olecrannon bursitis and also chronic  osteomyelitis of the left elbow.  (Images below).  Depression/anxiety -continue Zoloft  Hyperlipidemia -continue Lipitor  Tobacco abuse -counseled on importance of cessation.  Nicotine patch ordered.  Insomnia -continue trazodone   DVT prophylaxis:    Diet:  Diet Orders (From admission, onward)    Start     Ordered   04/15/20  1158  Diet Heart Room service appropriate? Yes; Fluid consistency: Thin; Fluid restriction: 1800 mL Fluid  Diet effective now       Comments: Please send finger foods, like sandwiches for lunch, etc. Pt able to feed himself these items  Question Answer Comment  Room service appropriate? Yes   Fluid consistency: Thin   Fluid restriction: 1800 mL Fluid      04/15/20 1158            Code Status: Full Code    Subjective 04/16/20    Patient was sleeping but woke easily when seen this morning.  He reports still having ongoing cough, no fevers or chills.  States his bottom hurts, feels like being cut.  Says that he feels like he is not pulling in enough air and asked for his inhaler.  O2 sat was 100% at the time and he appeared in no respiratory distress.  Lungs were clear as well.     Disposition Plan & Communication   Status is: Inpatient  Remains inpatient appropriate because:IV treatments appropriate due to intensity of illness or inability to take PO.  Currently with AKI requiring further monitoring and evaluation.  Unable to DC to SNF until 10-day isolation period for COVID.  Unsafe to return home.   Dispo: The patient is from: Home              Anticipated d/c is to: SNF              Anticipated d/c date is: 04/22/2020              Patient currently is not medically stable to d/c.   Family Communication: spoke with daughter-in-law Altamease OilerBetty Jo [place] by phone this afternoon.   Consults, Procedures, Significant Events   Consultants:   None  Procedures:   None  Antimicrobials:  Anti-infectives (From admission, onward)   Start     Dose/Rate Route Frequency Ordered Stop   04/14/20 1000  remdesivir 100 mg in sodium chloride 0.9 % 100 mL IVPB       "Followed by" Linked Group Details   100 mg 200 mL/hr over 30 Minutes Intravenous Daily 04/13/20 1004 04/18/20 0959   04/13/20 1130  remdesivir 200 mg in sodium chloride 0.9% 250 mL IVPB       "Followed by" Linked Group Details    200 mg 580 mL/hr over 30 Minutes Intravenous Once 04/13/20 1004 04/13/20 1206   04/08/20 2200  ciprofloxacin (CIPRO) tablet 500 mg  Status:  Discontinued        500 mg Oral 2 times daily 04/08/20 2048 04/09/20 1131         Objective   Vitals:   04/16/20 0426 04/16/20 0655 04/16/20 0735 04/16/20 1139  BP:   (!) 93/59 94/61  Pulse: 64 66 61 (!) 58  Resp: 17 14 16 18   Temp:   98.8 F (37.1 C) 98 F (36.7 C)  TempSrc:      SpO2: 99% 99% 100% 100%  Weight:      Height:        Intake/Output Summary (Last 24 hours) at 04/16/2020 1557 Last data filed  at 04/16/2020 1300 Gross per 24 hour  Intake --  Output 1390 ml  Net -1390 ml   Filed Weights   04/13/20 0440 04/15/20 0533  Weight: 39.9 kg (!) 169.3 kg    Physical Exam:  General exam: sleeping, woke easily, nods off during encounter, no acute distress Respiratory system: CTAB no wheezes or rhonchi, normal respiratory effort, on 2 L/min nasal cannula oxygen. Cardiovascular system: normal S1/S2, RRR, no pedal edema.   GI: soft and non-tender Central nervous system: no gross focal neurologic deficits, normal speech Extremities: moves all, no edema, normal tone, b/l upper extremity edema L>R.  Left elbow deformity with elbow and upper arm edema.          Labs   Data Reviewed: I have personally reviewed following labs and imaging studies  CBC: Recent Labs  Lab 04/13/20 0820 04/13/20 1307 04/14/20 0643 04/15/20 0541 04/16/20 0452  WBC 4.4  --  5.0 3.1* 3.4*  HGB 9.6* 10.4* 9.5* 9.9* 10.1*  HCT 29.8*  --  29.7* 31.3* 31.2*  MCV 91.7  --  91.4 92.3 92.6  PLT 164  --  180 164 167   Basic Metabolic Panel: Recent Labs  Lab 04/12/20 0258 04/13/20 0820 04/14/20 0643 04/15/20 0541 04/16/20 0452  NA 139 137 140 139 144  K 4.8 5.2* 5.3* 5.0 4.3  CL 100 95* 99 97* 101  CO2 33* 32 34* 35* 37*  GLUCOSE 167* 155* 146* 150* 134*  BUN 34* 46* 54* 60* 62*  CREATININE 1.25* 1.26* 1.35* 1.25* 1.38*  CALCIUM 9.4 9.6  9.5 9.4 9.2   GFR: Estimated Creatinine Clearance: 61.3 mL/min (A) (by C-G formula based on SCr of 1.38 mg/dL (H)). Liver Function Tests: Recent Labs  Lab 04/14/20 0643 04/15/20 0541 04/16/20 0452  AST 26 24 27   ALT 27 29 37  ALKPHOS 77 75 73  BILITOT 1.7* 1.8* 1.7*  PROT 5.3* 5.4* 5.1*  ALBUMIN 2.8* 2.8* 2.8*   No results for input(s): LIPASE, AMYLASE in the last 168 hours. No results for input(s): AMMONIA in the last 168 hours. Coagulation Profile: No results for input(s): INR, PROTIME in the last 168 hours. Cardiac Enzymes: No results for input(s): CKTOTAL, CKMB, CKMBINDEX, TROPONINI in the last 168 hours. BNP (last 3 results) No results for input(s): PROBNP in the last 8760 hours. HbA1C: No results for input(s): HGBA1C in the last 72 hours. CBG: No results for input(s): GLUCAP in the last 168 hours. Lipid Profile: No results for input(s): CHOL, HDL, LDLCALC, TRIG, CHOLHDL, LDLDIRECT in the last 72 hours. Thyroid Function Tests: No results for input(s): TSH, T4TOTAL, FREET4, T3FREE, THYROIDAB in the last 72 hours. Anemia Panel: Recent Labs    04/15/20 0541 04/16/20 0452  FERRITIN 35 28   Sepsis Labs: Recent Labs  Lab 04/10/20 0536 04/11/20 0506  PROCALCITON <0.10 <0.10    Recent Results (from the past 240 hour(s))  SARS CORONAVIRUS 2 (TAT 6-24 HRS) Nasopharyngeal Nasopharyngeal Swab     Status: None   Collection Time: 04/08/20  7:28 PM   Specimen: Nasopharyngeal Swab  Result Value Ref Range Status   SARS Coronavirus 2 NEGATIVE NEGATIVE Final    Comment: (NOTE) SARS-CoV-2 target nucleic acids are NOT DETECTED.  The SARS-CoV-2 RNA is generally detectable in upper and lower respiratory specimens during the acute phase of infection. Negative results do not preclude SARS-CoV-2 infection, do not rule out co-infections with other pathogens, and should not be used as the sole basis for treatment or other patient  management decisions. Negative results must be  combined with clinical observations, patient history, and epidemiological information. The expected result is Negative.  Fact Sheet for Patients: HairSlick.no  Fact Sheet for Healthcare Providers: quierodirigir.com  This test is not yet approved or cleared by the [place] FDA and  has been authorized for detection and/or diagnosis of SARS-CoV-2 by FDA under an Emergency Use Authorization (EUA). This EUA will remain  in effect (meaning this test can be used) for the duration of the COVID-19 declaration under Se ction 564(b)(1) of the Act, 21 U.S.C. section 360bbb-3(b)(1), unless the authorization is terminated or revoked sooner.  Performed at [facility], [address], [place], [place] [address]   SARS CORONAVIRUS 2 (TAT 6-24 HRS) Nasopharyngeal Nasopharyngeal Swab     Status: Abnormal   Collection Time: 04/12/20 11:32 AM   Specimen: Nasopharyngeal Swab  Result Value Ref Range Status   SARS Coronavirus 2 POSITIVE (A) NEGATIVE Final    Comment: (NOTE) SARS-CoV-2 target nucleic acids are DETECTED.  The SARS-CoV-2 RNA is generally detectable in upper and lower respiratory specimens during the acute phase of infection. Positive results are indicative of the presence of SARS-CoV-2 RNA. Clinical correlation with patient history and other diagnostic information is  necessary to determine patient infection status. Positive results do not rule out bacterial infection or co-infection with other viruses.  The expected result is Negative.  Fact Sheet for Patients: HairSlick.no  Fact Sheet for Healthcare Providers: quierodirigir.com  This test is not yet approved or cleared by the [place] FDA and  has been authorized for detection and/or diagnosis of SARS-CoV-2 by FDA under an Emergency Use Authorization (EUA). This EUA will remain  in effect (meaning this test  can be used) for the duration of the COVID-19 declaration under Section 564(b)(1) of the Act, 21 U. S.C. section 360bbb-3(b)(1), unless the authorization is terminated or revoked sooner.   Performed at [facility], [address], [place], [place] [address]       Imaging Studies   No results found.   Medications   Scheduled Meds: . vitamin C  500 mg Oral Daily  . atorvastatin  20 mg Oral 1 day or 1 dose  . brimonidine  1 drop Both Eyes Q8H  . dextromethorphan-guaiFENesin  1 tablet Oral BID  . enoxaparin (LOVENOX) injection  0.5 mg/kg Subcutaneous Q24H  . ferrous sulfate  325 mg Oral BID WC  . fluticasone furoate-vilanterol  1 puff Inhalation Daily  . hydrocerin   Topical Daily  . latanoprost  1 drop Both Eyes QHS  . metoprolol succinate  12.5 mg Oral QHS  . nicotine  21 mg Transdermal Daily  . predniSONE  40 mg Oral Q breakfast  . sertraline  25 mg Oral Daily  . sodium chloride flush  3 mL Intravenous Q12H  . sodium zirconium cyclosilicate  10 g Oral Once  . timolol  1 drop Both Eyes BID  . tiotropium  18 mcg Inhalation Daily  . zinc sulfate  220 mg Oral Daily   Continuous Infusions: . sodium chloride 250 mL (04/16/20 0916)  . remdesivir 100 mg in NS 100 mL 100 mg (04/16/20 0918)       LOS: 8 days    Time spent: 25 minutes with greater than 50% spent at bedside and in coordination of care    Pennie Banter, DO [facility]  04/16/2020, 3:57 PM    If 7PM-7AM, please contact night-coverage. How to contact the  [facility] Attending or Consulting provider 7A - 7P or covering provider during after hours 7P -7A, for this patient?    1. Check the care team in [facility] and look for a) attending/consulting [facility] provider listed and b) the [facility] team listed 2. Log into [email or web address] and use [facility] universal password to access. If you do not have the password, please contact the hospital operator. 3. Locate the [facility] provider you are looking for under [facility] and page to a number that you can be directly reached. 4. If you still have difficulty reaching the provider, please page the [facility] (Director on Call) for the Hospitalists listed on amion for assistance.

## 2020-04-16 NOTE — Progress Notes (Addendum)
Attempted to call Teresa Coombs to give nightly update, no answer, left message for her to return call

## 2020-04-16 NOTE — Progress Notes (Addendum)
Jon Billings NP made aware that per tele monitoring pt had 8 beat run vtach with wide QRS, then back to afib HR 75, acknowledged

## 2020-04-16 NOTE — Progress Notes (Signed)
ANTICOAGULATION CONSULT NOTE - Initial Consult  Pharmacy Consult for Lovenox Indication: VTE prophylaxis  Allergies  Allergen Reactions  . Trazodone Anxiety    Altered mental status  . Linezolid Other (See Comments)    Sore on the inside of mouth and lips   . Sulfa Antibiotics Other (See Comments) and Rash    Other Reaction: hyperactivity    Patient Measurements: Height: 5\' 8"  (172.7 cm) Weight: (!) 169.3 kg (373 lb 3.8 oz) IBW/kg (Calculated) : 68.4 Heparin Dosing Weight:    Vital Signs: Temp: 98 F (36.7 C) (01/14 1139) BP: 94/61 (01/14 1139) Pulse Rate: 58 (01/14 1139)  Labs: Recent Labs    04/14/20 0643 04/15/20 0541 04/16/20 0452  HGB 9.5* 9.9* 10.1*  HCT 29.7* 31.3* 31.2*  PLT 180 164 167  CREATININE 1.35* 1.25* 1.38*    Estimated Creatinine Clearance: 61.3 mL/min (A) (by C-G formula based on SCr of 1.38 mg/dL (H)).   Medical History: Past Medical History:  Diagnosis Date  . Blind left eye   . Hypertension   . Macular degeneration of right eye     Medications:  Scheduled:  . vitamin C  500 mg Oral Daily  . atorvastatin  20 mg Oral 1 day or 1 dose  . brimonidine  1 drop Both Eyes Q8H  . dextromethorphan-guaiFENesin  1 tablet Oral BID  . enoxaparin (LOVENOX) injection  0.5 mg/kg Subcutaneous Q24H  . ferrous sulfate  325 mg Oral BID WC  . fluticasone furoate-vilanterol  1 puff Inhalation Daily  . hydrocerin   Topical Daily  . latanoprost  1 drop Both Eyes QHS  . metoprolol succinate  12.5 mg Oral QHS  . nicotine  21 mg Transdermal Daily  . predniSONE  40 mg Oral Q breakfast  . sertraline  25 mg Oral Daily  . sodium chloride flush  3 mL Intravenous Q12H  . sodium zirconium cyclosilicate  10 g Oral Once  . timolol  1 drop Both Eyes BID  . tiotropium  18 mcg Inhalation Daily  . zinc sulfate  220 mg Oral Daily   Infusions:  . sodium chloride 250 mL (04/16/20 0916)  . remdesivir 100 mg in NS 100 mL 100 mg (04/16/20 04/18/20)    Assessment: 86 yo  M admitted w/ AECHF, AECOPD, covid + when tested to be discharged to facility.   Goal of Therapy:  Monitor platelets by anticoagulation protocol: Yes   Plan:  Patient currently on Lovenox 0.5 mg/kg (85 mg) q24h for DVT prophylaxis with BMI >30  (BMI 56).  Hgb 10.1  plt 167 Will continue current dose. F/u Scr/CBC per protocol  Jadden Yim A 04/16/2020,4:34 PM

## 2020-04-17 DIAGNOSIS — J9622 Acute and chronic respiratory failure with hypercapnia: Secondary | ICD-10-CM | POA: Diagnosis not present

## 2020-04-17 DIAGNOSIS — J9621 Acute and chronic respiratory failure with hypoxia: Secondary | ICD-10-CM | POA: Diagnosis not present

## 2020-04-17 LAB — BASIC METABOLIC PANEL
Anion gap: 7 (ref 5–15)
BUN: 58 mg/dL — ABNORMAL HIGH (ref 8–23)
CO2: 37 mmol/L — ABNORMAL HIGH (ref 22–32)
Calcium: 9.1 mg/dL (ref 8.9–10.3)
Chloride: 99 mmol/L (ref 98–111)
Creatinine, Ser: 1.16 mg/dL (ref 0.61–1.24)
GFR, Estimated: >60 mL/min (ref >60)
Glucose, Bld: 125 mg/dL — ABNORMAL HIGH (ref 70–99)
Potassium: 5 mmol/L (ref 3.5–5.1)
Sodium: 143 mmol/L (ref 135–145)

## 2020-04-17 LAB — CBC
HCT: 32.6 % — ABNORMAL LOW (ref 39.0–52.0)
Hemoglobin: 10.3 g/dL — ABNORMAL LOW (ref 13.0–17.0)
MCH: 29.2 pg (ref 26.0–34.0)
MCHC: 31.6 g/dL (ref 30.0–36.0)
MCV: 92.4 fL (ref 80.0–100.0)
Platelets: 163 10*3/uL (ref 150–400)
RBC: 3.53 MIL/uL — ABNORMAL LOW (ref 4.22–5.81)
RDW: 15.3 % (ref 11.5–15.5)
WBC: 3.8 10*3/uL — ABNORMAL LOW (ref 4.0–10.5)
nRBC: 0 % (ref 0.0–0.2)

## 2020-04-17 MED ORDER — ASPIRIN EC 81 MG PO TBEC
81.0000 mg | DELAYED_RELEASE_TABLET | Freq: Every day | ORAL | Status: DC
  Administered 2020-04-18 – 2020-04-22 (×5): 81 mg via ORAL
  Filled 2020-04-17 (×5): qty 1

## 2020-04-17 MED ORDER — ATORVASTATIN CALCIUM 20 MG PO TABS
20.0000 mg | ORAL_TABLET | Freq: Every day | ORAL | Status: DC
Start: 2020-04-17 — End: 2020-04-22
  Administered 2020-04-18 – 2020-04-21 (×4): 20 mg via ORAL
  Filled 2020-04-17 (×4): qty 1

## 2020-04-17 NOTE — Progress Notes (Signed)
PROGRESS NOTE    [name]   TKZ:[id number]  DOB: [birth year]  PCP: Marina Goodell, MD    DOA: 04/08/2020 LOS: 9   Brief Narrative   Corey Wong is a 86 y.o. male with medical history significant of chronic diastolic CHF, paroxysmal A. fib with history of refusing systemic anticoagulation, COPD, cor pulmonale, HTN, IDDM, morbid obesity, macular degeneration, left-sided blindness, sleep apnea diagnosed in 2014 refuses CPAP, presented with increasing short of breath, generalized weakness and leg swelling.  Patient was initially admitted for management of acute CHF decompensation.  Discharge to short-term rehab was being planned and patient had to be screened for COVID-19 which resulted positive.     Assessment & Plan   Active Problems:   Acute diastolic CHF (congestive heart failure) (HCC)   COPD with acute exacerbation (HCC)   Acute CHF (congestive heart failure) (HCC)   CHF (congestive heart failure) (HCC)   Insomnia   Chronic a-fib (HCC)   Obesity (BMI 30-39.9)   Anxiety and depression   Anasarca   Acquired thrombophilia (HCC)   Acute on chronic respiratory failure with hypoxia and hypercapnia (HCC)   COVID-19 virus infection   Acute on chronic respiratory failure with hypoxia -due to COVID-19 and COPD.  BiPAP was tried on 2 nights but not tolerated.  Continue supplemental oxygen at night and as needed during the day overnight oximetry with quite a bit of time with SPO2 in the 80s. --Continue supplemental oxygen as needed to maintain O2 sat greater than 88%, wean as tolerated  COVID-19 infection with COPD exacerbation -patient tested negative for COVID on 1/6, subsequently positive when screened for SNF placement. -- Continue remdesivir, steroids, zinc, vitamin C, inhalers per orders -- Mucinex scheduled, Tussionex as needed  Acute on chronic diastolic CHF with anasarca - Net IO Since Admission: -8,384.28 mL [04/17/20 1519] Has been transitioned from IV diuresis  back to torsemide.  Torsemide on hold for persistent AKI.  Patient is clinically dry and with contraction alkalosis. --Continue Toprol -- Hold torsemide due to mildly worsening renal function -- Strict I/O's and daily weights -- Leg wraps for edema  Acute normocytic anemia -not present on admission.  Hemoglobin was initially 13.6, dropped to 9.6 on 1/11.  No evidence of bleeding.  Follow-up stool guaiac, but stools reported as brown.  No reports of melena or hematochezia.  LDH is slightly low. --Resume aspirin  --Hemoglobin stable past few days and improving --Continue Lovenox for VTE prophylaxis --Monitor CBC  Hyperkalemia -K5.3 on 1/12.  Lokelma given.  Monitor BMP.  AKI -Resolved with holding torsemide.  AKI was due to diuresis.   Creatinine on 1/7 was 0.99, rose to 1.35 >>1.25...Marland Kitchen1.38.  Monitor BMP. Hold torsemide, consider resume tomorrow.  Volume status is stable.  Generalized weakness -SNF recommended by therapy for short-term rehab.  Accepted at [facility] and still wants to go there however will not be accepted until after 10-day isolation. --Continue therapy here is much as possible.  Chronic A. Fib -rate controlled.  Continue metoprolol.  Not on anticoagulation.  Aspirin resumed.  Acquired thrombophilia secondary to A. Fib -patient is at higher risk of stroke as he is not on anticoagulation for A. Fib.  Right shoulder pain -improved.  PT and OT.  Left upper extremity and elbow swelling - chronic and at baseline per patient, not tender or warm on exam.  Chart review and discussion with family confirm this is a chronic issue.   Pt with hx of L olecrannon  bursitis and also chronic osteomyelitis of the left elbow.    Depression/anxiety -continue Zoloft  Hyperlipidemia -continue Lipitor  Tobacco abuse -counseled on importance of cessation.  Nicotine patch ordered.  Insomnia -continue trazodone   DVT prophylaxis:    Diet:  Diet Orders (From admission, onward)    Start      Ordered   04/15/20 1158  Diet Heart Room service appropriate? Yes; Fluid consistency: Thin; Fluid restriction: 1800 mL Fluid  Diet effective now       Comments: Please send finger foods, like sandwiches for lunch, etc. Pt able to feed himself these items  Question Answer Comment  Room service appropriate? Yes   Fluid consistency: Thin   Fluid restriction: 1800 mL Fluid      04/15/20 1158            Code Status: Full Code    Subjective 04/17/20    Patient sleeping comfortably when seen today.  No acute events reported.  Pt briefly wakes up and denies any acute complaints.  Promptly falls back to sleep.   Disposition Plan & Communication   Status is: Inpatient  Remains inpatient appropriate because:IV treatments appropriate due to intensity of illness or inability to take PO.  Unable to DC to SNF until 10-day isolation period for COVID.  Unsafe to return home.   Dispo: The patient is from: Home              Anticipated d/c is to: SNF              Anticipated d/c date is: 04/22/2020              Patient currently is not medically stable to d/c.   Family Communication: spoke with daughter-in-law Corey Wong by phone this afternoon.   Consults, Procedures, Significant Events   Consultants:   None  Procedures:   None  Antimicrobials:  Anti-infectives (From admission, onward)   Start     Dose/Rate Route Frequency Ordered Stop   04/14/20 1000  remdesivir 100 mg in sodium chloride 0.9 % 100 mL IVPB       "Followed by" Linked Group Details   100 mg 200 mL/hr over 30 Minutes Intravenous Daily 04/13/20 1004 04/17/20 1038   04/13/20 1130  remdesivir 200 mg in sodium chloride 0.9% 250 mL IVPB       "Followed by" Linked Group Details   200 mg 580 mL/hr over 30 Minutes Intravenous Once 04/13/20 1004 04/13/20 1206   04/08/20 2200  ciprofloxacin (CIPRO) tablet 500 mg  Status:  Discontinued        500 mg Oral 2 times daily 04/08/20 2048 04/09/20 1131         Objective    Vitals:   04/17/20 0403 04/17/20 0608 04/17/20 0744 04/17/20 1208  BP:  114/63 106/68 (!) 101/55  Pulse: 76 (!) 56 80 (!) 59  Resp: 17 16 18 18   Temp:  97.7 F (36.5 C) (!) 97.5 F (36.4 C) (!) 97.3 F (36.3 C)  TempSrc:      SpO2: 99% 100% 100% 96%  Weight:      Height:        Intake/Output Summary (Last 24 hours) at 04/17/2020 1519 Last data filed at 04/17/2020 0956 Gross per 24 hour  Intake 212.72 ml  Output 1200 ml  Net -987.28 ml   Filed Weights   04/13/20 0440 04/15/20 0533  Weight: 39.9 kg (!) 169.3 kg    Physical Exam:  General exam:  sleeping comfortably, no acute distress Respiratory system: symmetric chest rise, normal respiratory effort, on 2 L/min nasal cannula oxygen. Cardiovascular system: normal S1/S2, RRR, no pedal edema.   GI: soft and non-tender Extremities: moves all, no edema, normal tone, b/l upper extremity edema L>R.  Left elbow deformity.   Labs   Data Reviewed: I have personally reviewed following labs and imaging studies  CBC: Recent Labs  Lab 04/13/20 0820 04/13/20 1307 04/14/20 0643 04/15/20 0541 04/16/20 0452 04/17/20 0540  WBC 4.4  --  5.0 3.1* 3.4* 3.8*  HGB 9.6* 10.4* 9.5* 9.9* 10.1* 10.3*  HCT 29.8*  --  29.7* 31.3* 31.2* 32.6*  MCV 91.7  --  91.4 92.3 92.6 92.4  PLT 164  --  180 164 167 163   Basic Metabolic Panel: Recent Labs  Lab 04/13/20 0820 04/14/20 0643 04/15/20 0541 04/16/20 0452 04/17/20 0540  NA 137 140 139 144 143  K 5.2* 5.3* 5.0 4.3 5.0  CL 95* 99 97* 101 99  CO2 32 34* 35* 37* 37*  GLUCOSE 155* 146* 150* 134* 125*  BUN 46* 54* 60* 62* 58*  CREATININE 1.26* 1.35* 1.25* 1.38* 1.16  CALCIUM 9.6 9.5 9.4 9.2 9.1   GFR: Estimated Creatinine Clearance: 73 mL/min (by C-G formula based on SCr of 1.16 mg/dL). Liver Function Tests: Recent Labs  Lab 04/14/20 0643 04/15/20 0541 04/16/20 0452  AST 26 24 27   ALT 27 29 37  ALKPHOS 77 75 73  BILITOT 1.7* 1.8* 1.7*  PROT 5.3* 5.4* 5.1*  ALBUMIN 2.8*  2.8* 2.8*   No results for input(s): LIPASE, AMYLASE in the last 168 hours. No results for input(s): AMMONIA in the last 168 hours. Coagulation Profile: No results for input(s): INR, PROTIME in the last 168 hours. Cardiac Enzymes: No results for input(s): CKTOTAL, CKMB, CKMBINDEX, TROPONINI in the last 168 hours. BNP (last 3 results) No results for input(s): PROBNP in the last 8760 hours. HbA1C: No results for input(s): HGBA1C in the last 72 hours. CBG: No results for input(s): GLUCAP in the last 168 hours. Lipid Profile: No results for input(s): CHOL, HDL, LDLCALC, TRIG, CHOLHDL, LDLDIRECT in the last 72 hours. Thyroid Function Tests: No results for input(s): TSH, T4TOTAL, FREET4, T3FREE, THYROIDAB in the last 72 hours. Anemia Panel: Recent Labs    04/15/20 0541 04/16/20 0452  FERRITIN 35 28   Sepsis Labs: Recent Labs  Lab 04/11/20 0506  PROCALCITON <0.10    Recent Results (from the past 240 hour(s))  SARS CORONAVIRUS 2 (TAT 6-24 HRS) Nasopharyngeal Nasopharyngeal Swab     Status: None   Collection Time: 04/08/20  7:28 PM   Specimen: Nasopharyngeal Swab  Result Value Ref Range Status   SARS Coronavirus 2 NEGATIVE NEGATIVE Final    Comment: (NOTE) SARS-CoV-2 target nucleic acids are NOT DETECTED.  The SARS-CoV-2 RNA is generally detectable in upper and lower respiratory specimens during the acute phase of infection. Negative results do not preclude SARS-CoV-2 infection, do not rule out co-infections with other pathogens, and should not be used as the sole basis for treatment or other patient management decisions. Negative results must be combined with clinical observations, patient history, and epidemiological information. The expected result is Negative.  Fact Sheet for Patients: 06/06/20  Fact Sheet for Healthcare Providers: HairSlick.no  This test is not yet approved or cleared by the [place] FDA and  has been authorized for detection and/or diagnosis of SARS-CoV-2 by FDA under an Emergency Use Authorization (EUA). This EUA will remain  in effect (meaning this test can be used) for the duration of the COVID-19 declaration under Se ction 564(b)(1) of the Act, 21 U.S.C. section 360bbb-3(b)(1), unless the authorization is terminated or revoked sooner.  Performed at [facility], [address] [place], [place] [address]   SARS CORONAVIRUS 2 (TAT 6-24 HRS) Nasopharyngeal Nasopharyngeal Swab     Status: Abnormal   Collection Time: 04/12/20 11:32 AM   Specimen: Nasopharyngeal Swab  Result Value Ref Range Status   SARS Coronavirus 2 POSITIVE (A) NEGATIVE Final    Comment: (NOTE) SARS-CoV-2 target nucleic acids are DETECTED.  The SARS-CoV-2 RNA is generally detectable in upper and lower respiratory specimens during the acute phase of infection. Positive results are indicative of the presence of SARS-CoV-2 RNA. Clinical correlation with patient history and other diagnostic information is  necessary to determine patient infection status. Positive results do not rule out bacterial infection or co-infection with other viruses.  The expected result is Negative.  Fact Sheet for Patients: HairSlick.nohttps://www.fda.gov/media/138098/download  Fact Sheet for Healthcare Providers: quierodirigir.comhttps://www.fda.gov/media/138095/download  This test is not yet approved or cleared by the [place] FDA and  has been authorized for detection and/or diagnosis of SARS-CoV-2 by FDA under an Emergency Use Authorization (EUA). This EUA will remain  in effect (meaning this test can be used) for the duration of the COVID-19 declaration under Section 564(b)(1) of the Act, 21 U. S.C. section 360bbb-3(b)(1), unless the authorization is terminated or revoked sooner.   Performed at [facility], [address] [place], [place] [address]       Imaging Studies   No results  found.   Medications   Scheduled Meds: . vitamin C  500 mg Oral Daily  . atorvastatin  20 mg Oral 1 day or 1 dose  . brimonidine  1 drop Both Eyes Q8H  . dextromethorphan-guaiFENesin  1 tablet Oral BID  . enoxaparin (LOVENOX) injection  0.5 mg/kg Subcutaneous Q24H  . ferrous sulfate  325 mg Oral BID WC  . fluticasone furoate-vilanterol  1 puff Inhalation Daily  . hydrocerin   Topical Daily  . latanoprost  1 drop Both Eyes QHS  . metoprolol succinate  12.5 mg Oral QHS  . nicotine  21 mg Transdermal Daily  . predniSONE  40 mg Oral Q breakfast  . sertraline  25 mg Oral Daily  . sodium chloride flush  3 mL Intravenous Q12H  . sodium zirconium cyclosilicate  10 g Oral Once  . timolol  1 drop Both Eyes BID  . tiotropium  18 mcg Inhalation Daily  . zinc sulfate  220 mg Oral Daily   Continuous Infusions: . sodium chloride Stopped (04/16/20 2105)       LOS: 9 days    Time spent: 20 minutes    Pennie BanterKelly A Maral Lampe, DO [facility]  04/17/2020, 3:19 PM    If 7PM-7AM, please contact night-coverage. How to contact the [facility] Attending or Consulting provider 7A - 7P or covering provider during after hours 7P -7A, for this patient?    1. Check the care team in [facility] and look for a) attending/consulting [facility] provider listed and b) the [facility] team listed 2. Log into [email or web address] and use [place]'s universal password to access. If you do not have the password, please contact the hospital operator. 3. Locate the [facility] provider you are looking for under [facility] and page to a number that you can be directly reached. 4. If you still have difficulty reaching the  provider, please page the [facility] (Director on Call) for the Hospitalists listed on amion for assistance.

## 2020-04-17 NOTE — Progress Notes (Signed)
Physical Therapy Treatment Patient Details Name: Corey Wong MRN: [id number] DOB: Oct 19, 1934 Today's Date: 04/17/2020    History of Present Illness Corey Wong is a 86 y.o. male with medical history significant of chronic diastolic CHF, paroxysmal A. fib with history of refusing systemic anticoagulation, COPD, cor pulmonale, HTN, IDDM, morbid obesity, macular degeneration, left-sided blindness, sleep apnea diagnosed in 2014 refuses CPAP, presented with increasing short of breath and leg swelling. Pt found to be (+) c COVID19 when tested in preparation for DC to facility.    PT Comments    Pt ready for session.  +2 dependant to reposition in bed.  Pt does put goo effort into supine ex 2 x 10 but limited by fatigue and SOB.  Sats remain >92%.  He is encouraged to complete HEP on his own.  He stated he is doing his ankle pumps but encouraged to add in exercises taught today.   Follow Up Recommendations  SNF;Supervision - Intermittent     Equipment Recommendations  Other (comment)    Recommendations for Other Services       Precautions / Restrictions Precautions Precautions: Fall Restrictions Weight Bearing Restrictions: No Other Position/Activity Restrictions: chronic arthritic right kne; severe bilat DJD of shoulders.    Mobility  Bed Mobility Overal bed mobility: Needs Assistance Bed Mobility: Rolling Rolling: Max assist;+2 for safety/equipment         General bed mobility comments: +2 to scoot up in bed  Transfers                 General transfer comment: deferred due to safety concerns/pt fatigue and pain  Ambulation/Gait                 Stairs             Wheelchair Mobility    Modified Rankin (Stroke Patients Only)       Balance   Sitting-balance support: Bilateral upper extremity supported;Feet supported Sitting balance-Leahy Scale: Poor Sitting balance - Comments: need minGuardA, intermittent minA, but tolerates 10 minutes at  EOB                                    Cognition Arousal/Alertness: Awake/alert Behavior During Therapy: WFL for tasks assessed/performed Overall Cognitive Status: Within Functional Limits for tasks assessed                                        Exercises Other Exercises Other Exercises: BLE A/AAROM 2 x 10 for supine ex    General Comments        Pertinent Vitals/Pain Pain Assessment: Faces Faces Pain Scale: Hurts a little bit Pain Location: endorses "butt" pain 2/2 bed positioning Pain Descriptors / Indicators: Sore Pain Intervention(s): Limited activity within patient's tolerance;Monitored during session    Home Living                      Prior Function            PT Goals (current goals can now be found in the care plan section) Progress towards PT goals: Progressing toward goals    Frequency    Min 2X/week      PT Plan Current plan remains appropriate    Co-evaluation  AM-PAC PT "6 Clicks" Mobility   Outcome Measure  Help needed turning from your back to your side while in a flat bed without using bedrails?: Total Help needed moving from lying on your back to sitting on the side of a flat bed without using bedrails?: Total Help needed moving to and from a bed to a chair (including a wheelchair)?: Total Help needed standing up from a chair using your arms (e.g., wheelchair or bedside chair)?: Total Help needed to walk in hospital room?: Total Help needed climbing 3-5 steps with a railing? : Total 6 Click Score: 6    End of Session Equipment Utilized During Treatment: Oxygen Activity Tolerance: Patient limited by fatigue;Patient tolerated treatment well Patient left: in bed;with call bell/phone within reach Nurse Communication: Mobility status PT Visit Diagnosis: Other abnormalities of gait and mobility (R26.89);Muscle weakness (generalized) (M62.81);Difficulty in walking, not elsewhere  classified (R26.2);Pain Pain - Right/Left: Right Pain - part of body: Shoulder     Time: 1020-1045 PT Time Calculation (min) (ACUTE ONLY): 25 min  Charges:  $Therapeutic Activity: 23-37 mins                    Danielle Dess, PTA 04/17/20, 11:07 AM

## 2020-04-18 DIAGNOSIS — U071 COVID-19: Secondary | ICD-10-CM | POA: Diagnosis not present

## 2020-04-18 DIAGNOSIS — J441 Chronic obstructive pulmonary disease with (acute) exacerbation: Secondary | ICD-10-CM | POA: Diagnosis not present

## 2020-04-18 DIAGNOSIS — I482 Chronic atrial fibrillation, unspecified: Secondary | ICD-10-CM | POA: Diagnosis not present

## 2020-04-18 LAB — BASIC METABOLIC PANEL
Anion gap: 6 (ref 5–15)
BUN: 46 mg/dL — ABNORMAL HIGH (ref 8–23)
CO2: 37 mmol/L — ABNORMAL HIGH (ref 22–32)
Calcium: 9.3 mg/dL (ref 8.9–10.3)
Chloride: 100 mmol/L (ref 98–111)
Creatinine, Ser: 0.98 mg/dL (ref 0.61–1.24)
GFR, Estimated: >60 mL/min (ref >60)
Glucose, Bld: 118 mg/dL — ABNORMAL HIGH (ref 70–99)
Potassium: 4.6 mmol/L (ref 3.5–5.1)
Sodium: 143 mmol/L (ref 135–145)

## 2020-04-18 LAB — HEMOGLOBIN AND HEMATOCRIT, BLOOD
HCT: 33.9 % — ABNORMAL LOW (ref 39.0–52.0)
Hemoglobin: 10.9 g/dL — ABNORMAL LOW (ref 13.0–17.0)

## 2020-04-18 MED ORDER — PREDNISONE 20 MG PO TABS
20.0000 mg | ORAL_TABLET | Freq: Every day | ORAL | Status: DC
  Administered 2020-04-19 – 2020-04-21 (×3): 20 mg via ORAL
  Filled 2020-04-18 (×3): qty 1

## 2020-04-18 NOTE — Progress Notes (Signed)
PROGRESS NOTE    [name]   TSV:[id number]  DOB: 1934-05-29  PCP: Marina Goodell, MD    DOA: 04/08/2020 LOS: 10   Brief Narrative   Corey Wong is a 86 y.o. male with medical history significant of chronic diastolic CHF, paroxysmal A. fib with history of refusing systemic anticoagulation, COPD, cor pulmonale, HTN, IDDM, morbid obesity, macular degeneration, left-sided blindness, sleep apnea diagnosed in 2014 refuses CPAP, presented with increasing short of breath, generalized weakness and leg swelling.  Patient was initially admitted for management of acute CHF decompensation.  Discharge to short-term rehab was being planned and patient had to be screened for COVID-19 which resulted positive.     Assessment & Plan   Active Problems:   Acute diastolic CHF (congestive heart failure) (HCC)   COPD with acute exacerbation (HCC)   Acute CHF (congestive heart failure) (HCC)   CHF (congestive heart failure) (HCC)   Insomnia   Chronic a-fib (HCC)   Obesity (BMI 30-39.9)   Anxiety and depression   Anasarca   Acquired thrombophilia (HCC)   Acute on chronic respiratory failure with hypoxia and hypercapnia (HCC)   COVID-19 virus infection   Acute on chronic respiratory failure with hypoxia -due to COVID-19 and COPD.  BiPAP was tried on 2 nights but not tolerated.  Continue supplemental oxygen at night and as needed during the day overnight oximetry with quite a bit of time with SPO2 in the 80s. --Continue supplemental oxygen as needed to maintain O2 sat greater than 88%, wean as tolerated  COVID-19 infection with COPD exacerbation -patient tested negative for COVID on 1/6, subsequently positive when screened for SNF placement. -- Continue remdesivir, steroids, zinc, vitamin C, inhalers per orders -- Mucinex scheduled, Tussionex as needed  Acute on chronic diastolic CHF with anasarca - Net IO Since Admission: -8,884.28 mL [04/18/20 1350] Has been transitioned from IV diuresis  back to torsemide.  Torsemide on hold for persistent AKI.  Patient is clinically dry and with contraction alkalosis. --Continue Toprol -- Hold torsemide due to mildly worsening renal function -- Strict I/O's and daily weights -- Leg wraps for edema  Acute normocytic anemia -not present on admission.  Hemoglobin was initially 13.6, dropped to 9.6 on 1/11.  No evidence of bleeding.  Follow-up stool guaiac, but stools reported as brown.  No reports of melena or hematochezia.  LDH is slightly low. --Resume aspirin  --Hemoglobin stable past few days and improving --Continue Lovenox for VTE prophylaxis --Monitor CBC  Hyperkalemia -K5.3 on 1/12.  Lokelma given.  Monitor BMP.  AKI -Resolved with holding torsemide.  AKI was due to diuresis.   Creatinine on 1/7 was 0.99, rose to 1.35 >>1.25...Marland Kitchen1.38.  Monitor BMP. Hold torsemide, consider resume tomorrow.  Volume status is stable.  Generalized weakness -SNF recommended by therapy for short-term rehab.  Accepted at [facility] and still wants to go there however will not be accepted until after 10-day isolation. --Continue therapy here is much as possible.  Chronic A. Fib -rate controlled.  Continue metoprolol.  Not on anticoagulation.  Aspirin resumed.  Acquired thrombophilia secondary to A. Fib -patient is at higher risk of stroke as he is not on anticoagulation for A. Fib.  Right shoulder pain -improved.  PT and OT.  Left upper extremity and elbow swelling - chronic and at baseline per patient, not tender or warm on exam.  Chart review and discussion with family confirm this is a chronic issue.   Pt with hx of L olecrannon  bursitis and also chronic osteomyelitis of the left elbow.    Depression/anxiety -continue Zoloft  Hyperlipidemia -continue Lipitor  Tobacco abuse -counseled on importance of cessation.  Nicotine patch ordered.  Insomnia -continue trazodone   DVT prophylaxis:    Diet:  Diet Orders (From admission, onward)    Start      Ordered   04/15/20 1158  Diet Heart Room service appropriate? Yes; Fluid consistency: Thin; Fluid restriction: 1800 mL Fluid  Diet effective now       Comments: Please send finger foods, like sandwiches for lunch, etc. Pt able to feed himself these items  Question Answer Comment  Room service appropriate? Yes   Fluid consistency: Thin   Fluid restriction: 1800 mL Fluid      04/15/20 1158            Code Status: Full Code    Subjective 04/18/20    Patient feels okay.  Still a lot of coughing but seems to be his baseline.  No acute complaints including SOB, CP, N/V, or F/C.   Disposition Plan & Communication   Status is: Inpatient  Remains inpatient appropriate because:IV treatments appropriate due to intensity of illness or inability to take PO.  Unable to DC to SNF until 10-day isolation period for COVID.  Unsafe to return home.   Dispo: The patient is from: Home              Anticipated d/c is to: SNF              Anticipated d/c date is: 04/22/2020              Patient currently is not medically stable to d/c.   Family Communication: spoke with daughter-in-law Altamease OilerBetty Jo Depaul by phone this afternoon.   Consults, Procedures, Significant Events   Consultants:   None  Procedures:   None  Antimicrobials:  Anti-infectives (From admission, onward)   Start     Dose/Rate Route Frequency Ordered Stop   04/14/20 1000  remdesivir 100 mg in sodium chloride 0.9 % 100 mL IVPB       "Followed by" Linked Group Details   100 mg 200 mL/hr over 30 Minutes Intravenous Daily 04/13/20 1004 04/17/20 1038   04/13/20 1130  remdesivir 200 mg in sodium chloride 0.9% 250 mL IVPB       "Followed by" Linked Group Details   200 mg 580 mL/hr over 30 Minutes Intravenous Once 04/13/20 1004 04/13/20 1206   04/08/20 2200  ciprofloxacin (CIPRO) tablet 500 mg  Status:  Discontinued        500 mg Oral 2 times daily 04/08/20 2048 04/09/20 1131         Objective   Vitals:   04/18/20 0541  04/18/20 0600 04/18/20 0803 04/18/20 1113  BP: (!) 128/108  (!) 120/93 115/61  Pulse: (!) 119 81 69 66  Resp: 18 16 20 19   Temp: (!) 97.4 F (36.3 C)  98.1 F (36.7 C) 97.9 F (36.6 C)  TempSrc:      SpO2: 100% 97% 100% 94%  Weight:      Height:        Intake/Output Summary (Last 24 hours) at 04/18/2020 1350 Last data filed at 04/17/2020 1630 Gross per 24 hour  Intake --  Output 500 ml  Net -500 ml   Filed Weights   04/13/20 0440 04/15/20 0533  Weight: 39.9 kg (!) 169.3 kg    Physical Exam:  General exam: awake and alert, no  acute distress Respiratory system: CTAB with exam limited by pt talking, normal respiratory effort, on 1 L/min nasal cannula oxygen. Cardiovascular system: RRR, no b/l LE edema proximal to wraps GI: soft and non-tender Extremities: moves all, no edema, normal tone, b/l upper extremity edema L>R.  Left elbow deformity.   Labs   Data Reviewed: I have personally reviewed following labs and imaging studies  CBC: Recent Labs  Lab 04/13/20 0820 04/13/20 1307 04/14/20 0643 04/15/20 0541 04/16/20 0452 04/17/20 0540 04/18/20 0520  WBC 4.4  --  5.0 3.1* 3.4* 3.8*  --   HGB 9.6*   < > 9.5* 9.9* 10.1* 10.3* 10.9*  HCT 29.8*  --  29.7* 31.3* 31.2* 32.6* 33.9*  MCV 91.7  --  91.4 92.3 92.6 92.4  --   PLT 164  --  180 164 167 163  --    < > = values in this interval not displayed.   Basic Metabolic Panel: Recent Labs  Lab 04/14/20 0643 04/15/20 0541 04/16/20 0452 04/17/20 0540 04/18/20 0520  NA 140 139 144 143 143  K 5.3* 5.0 4.3 5.0 4.6  CL 99 97* 101 99 100  CO2 34* 35* 37* 37* 37*  GLUCOSE 146* 150* 134* 125* 118*  BUN 54* 60* 62* 58* 46*  CREATININE 1.35* 1.25* 1.38* 1.16 0.98  CALCIUM 9.5 9.4 9.2 9.1 9.3   GFR: Estimated Creatinine Clearance: 86.3 mL/min (by C-G formula based on SCr of 0.98 mg/dL). Liver Function Tests: Recent Labs  Lab 04/14/20 0643 04/15/20 0541 04/16/20 0452  AST 26 24 27   ALT 27 29 37  ALKPHOS 77 75 73   BILITOT 1.7* 1.8* 1.7*  PROT 5.3* 5.4* 5.1*  ALBUMIN 2.8* 2.8* 2.8*   No results for input(s): LIPASE, AMYLASE in the last 168 hours. No results for input(s): AMMONIA in the last 168 hours. Coagulation Profile: No results for input(s): INR, PROTIME in the last 168 hours. Cardiac Enzymes: No results for input(s): CKTOTAL, CKMB, CKMBINDEX, TROPONINI in the last 168 hours. BNP (last 3 results) No results for input(s): PROBNP in the last 8760 hours. HbA1C: No results for input(s): HGBA1C in the last 72 hours. CBG: No results for input(s): GLUCAP in the last 168 hours. Lipid Profile: No results for input(s): CHOL, HDL, LDLCALC, TRIG, CHOLHDL, LDLDIRECT in the last 72 hours. Thyroid Function Tests: No results for input(s): TSH, T4TOTAL, FREET4, T3FREE, THYROIDAB in the last 72 hours. Anemia Panel: Recent Labs    04/16/20 0452  FERRITIN 28   Sepsis Labs: No results for input(s): PROCALCITON, LATICACIDVEN in the last 168 hours.  Recent Results (from the past 240 hour(s))  SARS CORONAVIRUS 2 (TAT 6-24 HRS) Nasopharyngeal Nasopharyngeal Swab     Status: None   Collection Time: 04/08/20  7:28 PM   Specimen: Nasopharyngeal Swab  Result Value Ref Range Status   SARS Coronavirus 2 NEGATIVE NEGATIVE Final    Comment: (NOTE) SARS-CoV-2 target nucleic acids are NOT DETECTED.  The SARS-CoV-2 RNA is generally detectable in upper and lower respiratory specimens during the acute phase of infection. Negative results do not preclude SARS-CoV-2 infection, do not rule out co-infections with other pathogens, and should not be used as the sole basis for treatment or other patient management decisions. Negative results must be combined with clinical observations, patient history, and epidemiological information. The expected result is Negative.  Fact Sheet for Patients: 06/06/20  Fact Sheet for Healthcare  Providers: HairSlick.no  This test is not yet approved or cleared by the qu[employer]irigir.com  States FDA and  has been authorized for detection and/or diagnosis of SARS-CoV-2 by FDA under an Emergency Use Authorization (EUA). This EUA will remain  in effect (meaning this test can be used) for the duration of the COVID-19 declaration under Se ction 564(b)(1) of the Act, 21 U.S.C. section 360bbb-3(b)(1), unless the authorization is terminated or revoked sooner.  Performed at [facility], [address], [place], [place] [address]   SARS CORONAVIRUS 2 (TAT 6-24 HRS) Nasopharyngeal Nasopharyngeal Swab     Status: Abnormal   Collection Time: 04/12/20 11:32 AM   Specimen: Nasopharyngeal Swab  Result Value Ref Range Status   SARS Coronavirus 2 POSITIVE (A) NEGATIVE Final    Comment: (NOTE) SARS-CoV-2 target nucleic acids are DETECTED.  The SARS-CoV-2 RNA is generally detectable in upper and lower respiratory specimens during the acute phase of infection. Positive results are indicative of the presence of SARS-CoV-2 RNA. Clinical correlation with patient history and other diagnostic information is  necessary to determine patient infection status. Positive results do not rule out bacterial infection or co-infection with other viruses.  The expected result is Negative.  Fact Sheet for Patients: HairSlick.no  Fact Sheet for Healthcare Providers: quierodirigir.com  This test is not yet approved or cleared by the [place] FDA and  has been authorized for detection and/or diagnosis of SARS-CoV-2 by FDA under an Emergency Use Authorization (EUA). This EUA will remain  in effect (meaning this test can be used) for the duration of the COVID-19 declaration under Section 564(b)(1) of the Act, 21 U. S.C. section 360bbb-3(b)(1), unless the authorization is terminated or revoked sooner.   Performed at [facility], [address], [place], [place] [address]       Imaging Studies   No results found.   Medications   Scheduled Meds: . vitamin C  500 mg Oral Daily  . aspirin EC  81 mg Oral Daily  . atorvastatin  20 mg Oral q1800  . brimonidine  1 drop Both Eyes Q8H  . dextromethorphan-guaiFENesin  1 tablet Oral BID  . enoxaparin (LOVENOX) injection  0.5 mg/kg Subcutaneous Q24H  . ferrous sulfate  325 mg Oral BID WC  . fluticasone furoate-vilanterol  1 puff Inhalation Daily  . hydrocerin   Topical Daily  . latanoprost  1 drop Both Eyes QHS  . metoprolol succinate  12.5 mg Oral QHS  . nicotine  21 mg Transdermal Daily  . predniSONE  40 mg Oral Q breakfast  . sertraline  25 mg Oral Daily  . sodium chloride flush  3 mL Intravenous Q12H  . timolol  1 drop Both Eyes BID  . tiotropium  18 mcg Inhalation Daily  . zinc sulfate  220 mg Oral Daily   Continuous Infusions: . sodium chloride Stopped (04/16/20 2105)       LOS: 10 days    Time spent: 20 minutes    Pennie Banter, DO [facility]  04/18/2020, 1:50 PM    If 7PM-7AM, please contact night-coverage. How to contact the [facility] Attending or Consulting provider 7A - 7P or covering provider during after hours 7P -7A, for this patient?    1. Check the care team in [facility] and look for a) attending/consulting TRH provider listed and b) the [facility] team listed 2. Log into [email or web address] and use [place]'s universal password to access. If you do not have the password, please contact the hospital operator. 3. Locate the [facility] provider you are looking for under  [facility] and page to a number that you can be directly reached. 4. If you still have difficulty reaching the provider, please page the [facility] (Director on Call) for the Hospitalists listed on amion for assistance.

## 2020-04-19 LAB — BASIC METABOLIC PANEL
Anion gap: 3 — ABNORMAL LOW (ref 5–15)
BUN: 46 mg/dL — ABNORMAL HIGH (ref 8–23)
CO2: 36 mmol/L — ABNORMAL HIGH (ref 22–32)
Calcium: 9.3 mg/dL (ref 8.9–10.3)
Chloride: 102 mmol/L (ref 98–111)
Creatinine, Ser: 0.91 mg/dL (ref 0.61–1.24)
GFR, Estimated: >60 mL/min (ref >60)
Glucose, Bld: 122 mg/dL — ABNORMAL HIGH (ref 70–99)
Potassium: 4.7 mmol/L (ref 3.5–5.1)
Sodium: 141 mmol/L (ref 135–145)

## 2020-04-19 MED ORDER — TORSEMIDE 20 MG PO TABS
20.0000 mg | ORAL_TABLET | Freq: Every day | ORAL | Status: DC
  Administered 2020-04-20 – 2020-04-22 (×3): 20 mg via ORAL
  Filled 2020-04-19 (×3): qty 1

## 2020-04-19 NOTE — Progress Notes (Signed)
Occupational Therapy Treatment Patient Details Name: Corey Wong MRN: [id number] DOB: 1934/11/08 Today's Date: 04/19/2020    History of present illness Corey Wong is a 86 y.o. male with medical history significant of chronic diastolic CHF, paroxysmal A. fib with history of refusing systemic anticoagulation, COPD, cor pulmonale, HTN, IDDM, morbid obesity, macular degeneration, left-sided blindness, sleep apnea diagnosed in 2014 refuses CPAP, presented with increasing short of breath and leg swelling. Pt found to be (+) c COVID19 when tested in preparation for DC to facility.   OT comments  Pt seen for OT tx this date focused on BLE and  BUE therapeutic exercise to improve strength for bed mobility and ADL tasks. (See details below). Pt tolerated well. LUE repositioned with pillow for improved comfort. Pt continues to require skilled OT services to maximize return to PLOF, minimize risk of falls, caregiver burden, and readmission.    Follow Up Recommendations  SNF    Equipment Recommendations  3 in 1 bedside commode    Recommendations for Other Services      Precautions / Restrictions Precautions Precautions: Fall Restrictions Weight Bearing Restrictions: No Other Position/Activity Restrictions: chronic arthritic right kne; severe bilat DJD of shoulders.       Mobility Bed Mobility Overal bed mobility: Needs Assistance             General bed mobility comments: +2 to scoot up in bed  Transfers                      Balance                                           ADL either performed or assessed with clinical judgement   ADL Overall ADL's : Needs assistance/impaired       Grooming Details (indicate cue type and reason): max a to wipe his face                                     Vision Patient Visual Report: No change from baseline     Perception     Praxis      Cognition Arousal/Alertness:  Awake/alert Behavior During Therapy: WFL for tasks assessed/performed Overall Cognitive Status: Within Functional Limits for tasks assessed                                          Exercises Other Exercises Other Exercises: BLE SLR x10 each side in supine, bicep curls x10 BUE, LUE shoulder flexion with elbow supported x10, RUE unable to 2/2 decreased shoulder ROM   Shoulder Instructions       General Comments      Pertinent Vitals/ Pain       Pain Assessment: No/denies pain  Home Living                                          Prior Functioning/Environment              Frequency  Min 1X/week        Progress Toward Goals  OT Goals(current goals can  now be found in the care plan section)  Progress towards OT goals: Progressing toward goals  Acute Rehab OT Goals Patient Stated Goal: to go home OT Goal Formulation: With patient Time For Goal Achievement: 04/23/20 Potential to Achieve Goals: Fair  Plan Discharge plan remains appropriate;Frequency remains appropriate    Co-evaluation                 AM-PAC OT "6 Clicks" Daily Activity     Outcome Measure   Help from another person eating meals?: A Lot Help from another person taking care of personal grooming?: A Lot Help from another person toileting, which includes using toliet, bedpan, or urinal?: A Lot Help from another person bathing (including washing, rinsing, drying)?: A Lot Help from another person to put on and taking off regular upper body clothing?: A Lot Help from another person to put on and taking off regular lower body clothing?: A Lot 6 Click Score: 12    End of Session    OT Visit Diagnosis: Other abnormalities of gait and mobility (R26.89)   Activity Tolerance Patient tolerated treatment well   Patient Left in bed;with call bell/phone within reach;with bed alarm set   Nurse Communication          Time: 0539-7673 OT Time Calculation (min):  18 min  Charges: OT General Charges $OT Visit: 1 Visit OT Treatments $Therapeutic Exercise: 8-22 mins  Richrd Prime, MPH, MS, OTR/L ascom [phone] 04/19/20, 3:57 PM

## 2020-04-19 NOTE — Progress Notes (Signed)
Updated daughter-in-law via phone. No changes in patient from previously documented assessment. Plan to discharge to SNF 1/20. Currently on 1L O2. Bo Mcclintock, RN

## 2020-04-19 NOTE — Progress Notes (Signed)
PROGRESS NOTE    Corey Wong   VWU:[id number]  DOB: [birth year]  PCP: Marina Goodell, MD    DOA: 04/08/2020 LOS: 11   Brief Narrative   Corey Wong is a 86 y.o. male with medical history significant of chronic diastolic CHF, paroxysmal A. fib with history of refusing systemic anticoagulation, COPD, cor pulmonale, HTN, IDDM, morbid obesity, macular degeneration, left-sided blindness, sleep apnea diagnosed in 2014 refuses CPAP, presented with increasing short of breath, generalized weakness and leg swelling.  Patient was initially admitted for management of acute CHF decompensation.  Discharge to short-term rehab was being planned and patient had to be screened for COVID-19 which resulted positive.     Assessment & Plan   Active Problems:   Acute diastolic CHF (congestive heart failure) (HCC)   COPD with acute exacerbation (HCC)   Acute CHF (congestive heart failure) (HCC)   CHF (congestive heart failure) (HCC)   Insomnia   Chronic a-fib (HCC)   Obesity (BMI 30-39.9)   Anxiety and depression   Anasarca   Acquired thrombophilia (HCC)   Acute on chronic respiratory failure with hypoxia and hypercapnia (HCC)   COVID-19 virus infection   Acute on chronic respiratory failure with hypoxia -due to COVID-19 and COPD.  BiPAP was tried on 2 nights but not tolerated.  Continue supplemental oxygen at night and as needed during the day overnight oximetry with quite a bit of time with SPO2 in the 80s. --Continue supplemental oxygen as needed to maintain O2 sat greater than 88%, wean as tolerated  COVID-19 infection with COPD exacerbation -patient tested negative for COVID on 1/6, subsequently positive when screened for SNF placement. -- Continue remdesivir, steroids, zinc, vitamin C, inhalers per orders -- Mucinex scheduled, Tussionex as needed  Acute on chronic diastolic CHF with anasarca - Net IO Since Admission: -8,644.28 mL [04/19/20 1615] Has been transitioned from IV diuresis  back to torsemide.  Torsemide on hold for persistent AKI.  Patient is clinically dry and with contraction alkalosis. --Continue Toprol -- Resume torsemide tomorrow, had been held for AKI which has resolved.   -- Strict I/O's and daily weights -- Leg wraps for edema  Acute normocytic anemia -not present on admission.  Hemoglobin was initially 13.6, dropped to 9.6 on 1/11.  Hbg has been improving.  No evidence of bleeding.   --Resumed aspirin  --Hemoglobin stable past few days and improving --Continue Lovenox for VTE prophylaxis --Monitor CBC  Hyperkalemia -K5.3 on 1/12.  Lokelma given.  Monitor BMP.  AKI -Resolved with holding torsemide.  AKI was due to diuresis.   Creatinine on 1/7 was 0.99, rose to 1.35 >>1.25...Marland Kitchen1.38.  Monitor BMP. Hold torsemide, consider resume tomorrow.  Volume status is stable.  Generalized weakness -SNF recommended by therapy for short-term rehab.  Accepted at [facility] and still wants to go there however will not be accepted until after 10-day isolation. --Continue therapy here is much as possible.  Chronic A. Fib -rate controlled.  Continue metoprolol.  Not on anticoagulation.  Aspirin resumed.  Acquired thrombophilia secondary to A. Fib -patient is at higher risk of stroke as he is not on anticoagulation for A. Fib.  Right shoulder pain -improved.  PT and OT.  Left upper extremity and elbow swelling - chronic and at baseline per patient, not tender or warm on exam.  Chart review and discussion with family confirm this is a chronic issue.   Pt with hx of L olecrannon bursitis and also chronic osteomyelitis of the left elbow.  Depression/anxiety -continue Zoloft  Hyperlipidemia -continue Lipitor  Tobacco abuse -counseled on importance of cessation.  Nicotine patch ordered.  Insomnia -continue trazodone   DVT prophylaxis:    Diet:  Diet Orders (From admission, onward)    Start     Ordered   04/15/20 1158  Diet Heart Room service appropriate? Yes;  Fluid consistency: Thin; Fluid restriction: 1800 mL Fluid  Diet effective now       Comments: Please send finger foods, like sandwiches for lunch, etc. Pt able to feed himself these items  Question Answer Comment  Room service appropriate? Yes   Fluid consistency: Thin   Fluid restriction: 1800 mL Fluid      04/15/20 1158            Code Status: Full Code    Subjective 04/19/20    Patient sleeping when seen. He wakes up but quickly falls back to sleep, as is usual for him.  No acute events.  He denied acute complaints when awake briefly.   Disposition Plan & Communication   Status is: Inpatient  Remains inpatient appropriate because:IV treatments appropriate due to intensity of illness or inability to take PO.  Unable to DC to SNF until 10-day isolation period for COVID.  Unsafe to return home.   Dispo: The patient is from: Home              Anticipated d/c is to: SNF              Anticipated d/c date is: 04/22/2020              Patient currently is not medically stable to d/c.   Family Communication: spoke with daughter-in-law Corey Wong by phone this afternoon.   Consults, Procedures, Significant Events   Consultants:   None  Procedures:   None  Antimicrobials:  Anti-infectives (From admission, onward)   Start     Dose/Rate Route Frequency Ordered Stop   04/14/20 1000  remdesivir 100 mg in sodium chloride 0.9 % 100 mL IVPB       "Followed by" Linked Group Details   100 mg 200 mL/hr over 30 Minutes Intravenous Daily 04/13/20 1004 04/17/20 1038   04/13/20 1130  remdesivir 200 mg in sodium chloride 0.9% 250 mL IVPB       "Followed by" Linked Group Details   200 mg 580 mL/hr over 30 Minutes Intravenous Once 04/13/20 1004 04/13/20 1206   04/08/20 2200  ciprofloxacin (CIPRO) tablet 500 mg  Status:  Discontinued        500 mg Oral 2 times daily 04/08/20 2048 04/09/20 1131         Objective   Vitals:   04/18/20 1525 04/18/20 2057 04/19/20 0620 04/19/20  0933  BP: (!) 106/51 120/61 99/80 119/65  Pulse: 67 91 70 (!) 59  Resp: 20 18 18 20   Temp: 97.6 F (36.4 C) 97.6 F (36.4 C) 97.7 F (36.5 C) 97.6 F (36.4 C)  TempSrc:   Oral Oral  SpO2: 95% 97% 97% 99%  Weight:      Height:       No intake or output data in the 24 hours ending 04/19/20 1615 Filed Weights   04/13/20 0440 04/15/20 0533  Weight: 39.9 kg (!) 169.3 kg    Physical Exam:  General exam: sleeping comfortably, wakes but drifts quickly back to sleep, no acute distress Respiratory system: CTAB anteriorly, normal respiratory effort. Cardiovascular system: RRR, no b/l LE edema proximal to wraps GI:  soft and non-tender Extremities: normal tone, b/l upper extremity edema L>R stable.  Left elbow deformity.   Labs   Data Reviewed: I have personally reviewed following labs and imaging studies  CBC: Recent Labs  Lab 04/13/20 0820 04/13/20 1307 04/14/20 0643 04/15/20 0541 04/16/20 0452 04/17/20 0540 04/18/20 0520  WBC 4.4  --  5.0 3.1* 3.4* 3.8*  --   HGB 9.6*   < > 9.5* 9.9* 10.1* 10.3* 10.9*  HCT 29.8*  --  29.7* 31.3* 31.2* 32.6* 33.9*  MCV 91.7  --  91.4 92.3 92.6 92.4  --   PLT 164  --  180 164 167 163  --    < > = values in this interval not displayed.   Basic Metabolic Panel: Recent Labs  Lab 04/15/20 0541 04/16/20 0452 04/17/20 0540 04/18/20 0520 04/19/20 0721  NA 139 144 143 143 141  K 5.0 4.3 5.0 4.6 4.7  CL 97* 101 99 100 102  CO2 35* 37* 37* 37* 36*  GLUCOSE 150* 134* 125* 118* 122*  BUN 60* 62* 58* 46* 46*  CREATININE 1.25* 1.38* 1.16 0.98 0.91  CALCIUM 9.4 9.2 9.1 9.3 9.3   GFR: Estimated Creatinine Clearance: 93 mL/min (by C-G formula based on SCr of 0.91 mg/dL). Liver Function Tests: Recent Labs  Lab 04/14/20 0643 04/15/20 0541 04/16/20 0452  AST 26 24 27   ALT 27 29 37  ALKPHOS 77 75 73  BILITOT 1.7* 1.8* 1.7*  PROT 5.3* 5.4* 5.1*  ALBUMIN 2.8* 2.8* 2.8*   No results for input(s): LIPASE, AMYLASE in the last 168 hours. No  results for input(s): AMMONIA in the last 168 hours. Coagulation Profile: No results for input(s): INR, PROTIME in the last 168 hours. Cardiac Enzymes: No results for input(s): CKTOTAL, CKMB, CKMBINDEX, TROPONINI in the last 168 hours. BNP (last 3 results) No results for input(s): PROBNP in the last 8760 hours. HbA1C: No results for input(s): HGBA1C in the last 72 hours. CBG: No results for input(s): GLUCAP in the last 168 hours. Lipid Profile: No results for input(s): CHOL, HDL, LDLCALC, TRIG, CHOLHDL, LDLDIRECT in the last 72 hours. Thyroid Function Tests: No results for input(s): TSH, T4TOTAL, FREET4, T3FREE, THYROIDAB in the last 72 hours. Anemia Panel: No results for input(s): VITAMINB12, FOLATE, FERRITIN, TIBC, IRON, RETICCTPCT in the last 72 hours. Sepsis Labs: No results for input(s): PROCALCITON, LATICACIDVEN in the last 168 hours.  Recent Results (from the past 240 hour(s))  SARS CORONAVIRUS 2 (TAT 6-24 HRS) Nasopharyngeal Nasopharyngeal Swab     Status: Abnormal   Collection Time: 04/12/20 11:32 AM   Specimen: Nasopharyngeal Swab  Result Value Ref Range Status   SARS Coronavirus 2 POSITIVE (A) NEGATIVE Final    Comment: (NOTE) SARS-CoV-2 target nucleic acids are DETECTED.  The SARS-CoV-2 RNA is generally detectable in upper and lower respiratory specimens during the acute phase of infection. Positive results are indicative of the presence of SARS-CoV-2 RNA. Clinical correlation with patient history and other diagnostic information is  necessary to determine patient infection status. Positive results do not rule out bacterial infection or co-infection with other viruses.  The expected result is Negative.  Fact Sheet for Patients: 06/10/20  Fact Sheet for Healthcare Providers: HairSlick.no  This test is not yet approved or cleared by the quierodirigir.com FDA and  has been authorized for detection and/or  diagnosis of SARS-CoV-2 by FDA under an Emergency Use Authorization (EUA). This EUA will remain  in effect (meaning this test can be used) for  the duration of the COVID-19 declaration under Section 564(b)(1) of the Act, 21 U. S.C. section 360bbb-3(b)(1), unless the authorization is terminated or revoked sooner.   Performed at [facility], [address] [place], [place] [address]       Imaging Studies   No results found.   Medications   Scheduled Meds: . vitamin C  500 mg Oral Daily  . aspirin EC  81 mg Oral Daily  . atorvastatin  20 mg Oral q1800  . brimonidine  1 drop Both Eyes Q8H  . dextromethorphan-guaiFENesin  1 tablet Oral BID  . enoxaparin (LOVENOX) injection  0.5 mg/kg Subcutaneous Q24H  . ferrous sulfate  325 mg Oral BID WC  . fluticasone furoate-vilanterol  1 puff Inhalation Daily  . hydrocerin   Topical Daily  . latanoprost  1 drop Both Eyes QHS  . metoprolol succinate  12.5 mg Oral QHS  . nicotine  21 mg Transdermal Daily  . predniSONE  20 mg Oral Q breakfast  . sertraline  25 mg Oral Daily  . sodium chloride flush  3 mL Intravenous Q12H  . timolol  1 drop Both Eyes BID  . tiotropium  18 mcg Inhalation Daily  . zinc sulfate  220 mg Oral Daily   Continuous Infusions: . sodium chloride Stopped (04/16/20 2105)       LOS: 11 days    Time spent: 20 minutes    Pennie BanterKelly A Mara Favero, DO [facility]  04/19/2020, 4:15 PM    If 7PM-7AM, please contact night-coverage. How to contact the [facility] Attending or Consulting provider 7A - 7P or covering provider during after hours 7P -7A, for this patient?    1. Check the care team in [facility] and look for a) attending/consulting TR[facility] provider listed and b) the [facility] team listed 2. Log into [email or web address] and use [place]'s universal password to access. If you do not have the password, please contact the hospital operator. 3. Locate the [facility] provider you are looking for under [facility] and page to a  number that you can be directly reached. 4. If you still have difficulty reaching the provider, please page the [facility] (Director on Call) for the Hospitalists listed on amion for assistance.

## 2020-04-20 LAB — BASIC METABOLIC PANEL
Anion gap: 5 (ref 5–15)
BUN: 41 mg/dL — ABNORMAL HIGH (ref 8–23)
CO2: 35 mmol/L — ABNORMAL HIGH (ref 22–32)
Calcium: 9.2 mg/dL (ref 8.9–10.3)
Chloride: 102 mmol/L (ref 98–111)
Creatinine, Ser: 0.94 mg/dL (ref 0.61–1.24)
GFR, Estimated: >60 mL/min (ref >60)
Glucose, Bld: 122 mg/dL — ABNORMAL HIGH (ref 70–99)
Potassium: 4.8 mmol/L (ref 3.5–5.1)
Sodium: 142 mmol/L (ref 135–145)

## 2020-04-20 MED ORDER — IPRATROPIUM-ALBUTEROL 20-100 MCG/ACT IN AERS
1.0000 | INHALATION_SPRAY | Freq: Four times a day (QID) | RESPIRATORY_TRACT | Status: DC
  Administered 2020-04-20 – 2020-04-22 (×6): 1 via RESPIRATORY_TRACT
  Filled 2020-04-20: qty 4

## 2020-04-20 MED ORDER — ALBUTEROL SULFATE HFA 108 (90 BASE) MCG/ACT IN AERS
2.0000 | INHALATION_SPRAY | RESPIRATORY_TRACT | Status: DC | PRN
  Filled 2020-04-20: qty 6.7

## 2020-04-20 NOTE — Progress Notes (Signed)
PT Cancellation Note  Patient Details Name: Corey Wong MRN: [id number] DOB: November 13, 1934   Cancelled Treatment:    Reason Eval/Treat Not Completed: Fatigue/lethargy limiting ability to participate   Pt offered and encouraged session.  Pt stated he was not feeling well today and was hot.  Blanket removed and heat turned down as it was on highest setting.  He stated he did not want to do ex out of fear of getting SOB.  Encouragement given and needs met.   Chesley Noon 04/20/2020, 1:02 PM

## 2020-04-20 NOTE — Progress Notes (Signed)
PROGRESS NOTE    Corey Wong   JXB:[id number]  DOB: 10/14/1934  PCP: Marina GoodellFeldpausch, Dale E, MD    DOA: 04/08/2020 LOS: 12   Brief Narrative   Corey Wong is a 86 y.o. male with medical history significant of chronic diastolic CHF, paroxysmal A. fib with history of refusing systemic anticoagulation, COPD, cor pulmonale, HTN, IDDM, morbid obesity, macular degeneration, left-sided blindness, sleep apnea diagnosed in 2014 refuses CPAP, presented with increasing short of breath, generalized weakness and leg swelling.  Patient was initially admitted for management of acute CHF decompensation.  Discharge to short-term rehab was being planned and patient had to be screened for COVID-19 which resulted positive.     Assessment & Plan   Active Problems:   Acute diastolic CHF (congestive heart failure) (HCC)   COPD with acute exacerbation (HCC)   Acute CHF (congestive heart failure) (HCC)   CHF (congestive heart failure) (HCC)   Insomnia   Chronic a-fib (HCC)   Obesity (BMI 30-39.9)   Anxiety and depression   Anasarca   Acquired thrombophilia (HCC)   Acute on chronic respiratory failure with hypoxia and hypercapnia (HCC)   COVID-19 virus infection   Acute on chronic respiratory failure with hypoxia -due to COVID-19 and COPD.  BiPAP was tried on 2 nights but not tolerated.  Continue supplemental oxygen at night and as needed during the day. Overnight oximetry with quite a bit of time with spO2 in the 80s. --Oxygen as needed to maintain O2 sat greater than 88%, wean as tolerated  COVID-19 infection with COPD exacerbation -patient tested negative for COVID on 1/6, subsequently positive when screened for SNF placement.   -- Continue remdesivir, steroids, zinc, vitamin C, inhalers per orders -- Mucinex scheduled, Tussionex as needed  Acute on chronic diastolic CHF with anasarca - Net IO Since Admission: -9,044.28 mL [04/20/20 1612] Has been transitioned from IV diuresis back to  torsemide.  Torsemide on hold for persistent AKI.  Patient is clinically dry and with contraction alkalosis. --Continue Toprol -- Resumed on torsemide 20 mg daily  -- Follow renal function and electrolytes  -- Strict I/O's and daily weights -- Leg wraps for edema  Acute normocytic anemia -not present on admission.  Hemoglobin was initially 13.6, dropped to 9.6 on 1/11.  Hbg has been improving.  No evidence of bleeding.   --Hemoglobin stable past few days and improving --Continue Lovenox for VTE prophylaxis --Resumed aspirin  --Monitor CBC  Hyperkalemia -K5.3 on 1/12.  Lokelma given.  Monitor BMP.  AKI -Resolved with holding torsemide.  AKI was due to diuresis.  Resolved.   Monitor BMP.    Generalized weakness -SNF recommended by therapy for short-term rehab.  Accepted at [facility], will d/c there after 10-day isolation, 1/20.   --Continue therapy here is much as possible.  Chronic A. Fib -rate controlled.  Continue metoprolol.  Not on anticoagulation.  Aspirin resumed.  Acquired thrombophilia secondary to A. Fib -patient is at higher risk of stroke as he is not on anticoagulation for A. Fib.  Right shoulder pain -improved.  PT and OT.  Left upper extremity and elbow swelling - chronic and at baseline per patient, not tender or warm on exam.  Chart review and discussion with family confirm this is a chronic issue.   Pt with hx of L olecrannon bursitis and also chronic osteomyelitis of the left elbow.    Depression/anxiety -continue Zoloft  Hyperlipidemia -continue Lipitor  Tobacco abuse -counseled on importance of cessation.  Nicotine patch  ordered.  Insomnia -continue trazodone   DVT prophylaxis:    Diet:  Diet Orders (From admission, onward)    Start     Ordered   04/15/20 1158  Diet Heart Room service appropriate? Yes; Fluid consistency: Thin; Fluid restriction: 1800 mL Fluid  Diet effective now       Comments: Please send finger foods, like sandwiches for lunch, etc. Pt  able to feed himself these items  Question Answer Comment  Room service appropriate? Yes   Fluid consistency: Thin   Fluid restriction: 1800 mL Fluid      04/15/20 1158            Code Status: Full Code    Subjective 04/20/20    Patient sleeping when seen, woke up easily.  Says he feels like he can't get enough air.  O2 sats are stable on 1 L oxygen.  Says cough is improved today.  No other acute complaints.    Disposition Plan & Communication   Status is: Inpatient  Remains inpatient appropriate because:IV treatments appropriate due to intensity of illness or inability to take PO.  Unable to DC to SNF until 10-day isolation period for COVID.  Unsafe to return home.   Dispo: The patient is from: Home              Anticipated d/c is to: SNF              Anticipated d/c date is: 04/22/2020              Patient currently is not medically stable to d/c.   Family Communication: none at bedside, will attempt to call daughter-in-law this afternoon.   Consults, Procedures, Significant Events   Consultants:   None  Procedures:   None  Antimicrobials:  Anti-infectives (From admission, onward)   Start     Dose/Rate Route Frequency Ordered Stop   04/14/20 1000  remdesivir 100 mg in sodium chloride 0.9 % 100 mL IVPB       "Followed by" Linked Group Details   100 mg 200 mL/hr over 30 Minutes Intravenous Daily 04/13/20 1004 04/17/20 1038   04/13/20 1130  remdesivir 200 mg in sodium chloride 0.9% 250 mL IVPB       "Followed by" Linked Group Details   200 mg 580 mL/hr over 30 Minutes Intravenous Once 04/13/20 1004 04/13/20 1206   04/08/20 2200  ciprofloxacin (CIPRO) tablet 500 mg  Status:  Discontinued        500 mg Oral 2 times daily 04/08/20 2048 04/09/20 1131         Objective   Vitals:   04/20/20 0300 04/20/20 0521 04/20/20 0724 04/20/20 1215  BP:  114/68 (!) 104/58 (!) 90/53  Pulse:  66 (!) 58 64  Resp:  18 16 16   Temp:  98.5 F (36.9 C) 98.7 F (37.1 C)  97.8 F (36.6 C)  TempSrc:  Oral    SpO2: 98% 98% 98% 96%  Weight:      Height:        Intake/Output Summary (Last 24 hours) at 04/20/2020 1612 Last data filed at 04/20/2020 0700 Gross per 24 hour  Intake -  Output 400 ml  Net -400 ml   Filed Weights   04/13/20 0440 04/15/20 0533  Weight: 39.9 kg (!) 169.3 kg    Physical Exam:  General exam: sleeping comfortably, wakes easily, no acute distress Respiratory system: expiratory wheezing, normal respiratory effort. Cardiovascular system: RRR, no b/l LE edema proximal  to wraps GI: soft and non-tender, +bowel sounds Extremities: b/l onychomycosis, hyperpigmentation of distal LE's, b/l upper extremity edema L>R stable.  Left elbow deformity.   Labs   Data Reviewed: I have personally reviewed following labs and imaging studies  CBC: Recent Labs  Lab 04/14/20 0643 04/15/20 0541 04/16/20 0452 04/17/20 0540 04/18/20 0520  WBC 5.0 3.1* 3.4* 3.8*  --   HGB 9.5* 9.9* 10.1* 10.3* 10.9*  HCT 29.7* 31.3* 31.2* 32.6* 33.9*  MCV 91.4 92.3 92.6 92.4  --   PLT 180 164 167 163  --    Basic Metabolic Panel: Recent Labs  Lab 04/16/20 0452 04/17/20 0540 04/18/20 0520 04/19/20 0721 04/20/20 0419  NA 144 143 143 141 142  K 4.3 5.0 4.6 4.7 4.8  CL 101 99 100 102 102  CO2 37* 37* 37* 36* 35*  GLUCOSE 134* 125* 118* 122* 122*  BUN 62* 58* 46* 46* 41*  CREATININE 1.38* 1.16 0.98 0.91 0.94  CALCIUM 9.2 9.1 9.3 9.3 9.2   GFR: Estimated Creatinine Clearance: 90 mL/min (by C-G formula based on SCr of 0.94 mg/dL). Liver Function Tests: Recent Labs  Lab 04/14/20 0643 04/15/20 0541 04/16/20 0452  AST 26 24 27   ALT 27 29 37  ALKPHOS 77 75 73  BILITOT 1.7* 1.8* 1.7*  PROT 5.3* 5.4* 5.1*  ALBUMIN 2.8* 2.8* 2.8*   No results for input(s): LIPASE, AMYLASE in the last 168 hours. No results for input(s): AMMONIA in the last 168 hours. Coagulation Profile: No results for input(s): INR, PROTIME in the last 168 hours. Cardiac  Enzymes: No results for input(s): CKTOTAL, CKMB, CKMBINDEX, TROPONINI in the last 168 hours. BNP (last 3 results) No results for input(s): PROBNP in the last 8760 hours. HbA1C: No results for input(s): HGBA1C in the last 72 hours. CBG: No results for input(s): GLUCAP in the last 168 hours. Lipid Profile: No results for input(s): CHOL, HDL, LDLCALC, TRIG, CHOLHDL, LDLDIRECT in the last 72 hours. Thyroid Function Tests: No results for input(s): TSH, T4TOTAL, FREET4, T3FREE, THYROIDAB in the last 72 hours. Anemia Panel: No results for input(s): VITAMINB12, FOLATE, FERRITIN, TIBC, IRON, RETICCTPCT in the last 72 hours. Sepsis Labs: No results for input(s): PROCALCITON, LATICACIDVEN in the last 168 hours.  Recent Results (from the past 240 hour(s))  SARS CORONAVIRUS 2 (TAT 6-24 HRS) Nasopharyngeal Nasopharyngeal Swab     Status: Abnormal   Collection Time: 04/12/20 11:32 AM   Specimen: Nasopharyngeal Swab  Result Value Ref Range Status   SARS Coronavirus 2 POSITIVE (A) NEGATIVE Final    Comment: (NOTE) SARS-CoV-2 target nucleic acids are DETECTED.  The SARS-CoV-2 RNA is generally detectable in upper and lower respiratory specimens during the acute phase of infection. Positive results are indicative of the presence of SARS-CoV-2 RNA. Clinical correlation with patient history and other diagnostic information is  necessary to determine patient infection status. Positive results do not rule out bacterial infection or co-infection with other viruses.  The expected result is Negative.  Fact Sheet for Patients: 06/10/20  Fact Sheet for Healthcare Providers: HairSlick.no  This test is not yet approved or cleared by the [employer]ir.com FDA and  has been authorized for detection and/or diagnosis of SARS-CoV-2 by FDA under an Emergency Use Authorization (EUA). This EUA will remain  in effect (meaning this test can be used) for the  duration of the COVID-19 declaration under Section 564(b)(1) of the Act, 21 U. S.C. section 360bbb-3(b)(1), unless the authorization is terminated or revoked sooner.  Performed at [facility], [address], [place], [place] [address]       Imaging Studies   No results found.   Medications   Scheduled Meds: . vitamin C  500 mg Oral Daily  . aspirin EC  81 mg Oral Daily  . atorvastatin  20 mg Oral q1800  . brimonidine  1 drop Both Eyes Q8H  . dextromethorphan-guaiFENesin  1 tablet Oral BID  . enoxaparin (LOVENOX) injection  0.5 mg/kg Subcutaneous Q24H  . ferrous sulfate  325 mg Oral BID WC  . fluticasone furoate-vilanterol  1 puff Inhalation Daily  . hydrocerin   Topical Daily  . Ipratropium-Albuterol  1 puff Inhalation Q6H  . latanoprost  1 drop Both Eyes QHS  . metoprolol succinate  12.5 mg Oral QHS  . nicotine  21 mg Transdermal Daily  . predniSONE  20 mg Oral Q breakfast  . sertraline  25 mg Oral Daily  . sodium chloride flush  3 mL Intravenous Q12H  . timolol  1 drop Both Eyes BID  . torsemide  20 mg Oral Daily  . zinc sulfate  220 mg Oral Daily   Continuous Infusions: . sodium chloride Stopped (04/16/20 2105)       LOS: 12 days    Time spent: 20 minutes    Pennie Banter, DO [facility]  04/20/2020, 4:12 PM    If 7PM-7AM, please contact night-coverage. How to contact the [facility] Attending or Consulting provider 7A - 7P or covering provider during after hours 7P -7A, for this patient?    1. Check the care team in [facility] and look for a) attending/consulting TRH provider listed and b) the [facility] team listed 2. Log into [email or web address] and use [place]'s universal password to access. If you do not have the password, please contact the hospital operator. 3. Locate the [facility] provider you are looking for under [facility] and page to a number that you can be directly reached. 4. If you still have difficulty reaching the provider, please page  the [facility] (Director on Call) for the Hospitalists listed on amion for assistance.

## 2020-04-21 LAB — BASIC METABOLIC PANEL
Anion gap: 6 (ref 5–15)
BUN: 42 mg/dL — ABNORMAL HIGH (ref 8–23)
CO2: 37 mmol/L — ABNORMAL HIGH (ref 22–32)
Calcium: 9.2 mg/dL (ref 8.9–10.3)
Chloride: 100 mmol/L (ref 98–111)
Creatinine, Ser: 1.01 mg/dL (ref 0.61–1.24)
GFR, Estimated: >60 mL/min (ref >60)
Glucose, Bld: 128 mg/dL — ABNORMAL HIGH (ref 70–99)
Potassium: 5 mmol/L (ref 3.5–5.1)
Sodium: 143 mmol/L (ref 135–145)

## 2020-04-21 MED ORDER — SENNOSIDES-DOCUSATE SODIUM 8.6-50 MG PO TABS: 2.0000 | ORAL_TABLET | Freq: Two times a day (BID) | ORAL | Status: DC

## 2020-04-21 MED ORDER — POLYETHYLENE GLYCOL 3350 17 G PO PACK: 17.0000 g | PACK | Freq: Every day | ORAL | Status: DC

## 2020-04-21 NOTE — Care Management Important Message (Signed)
Important Message  Patient Details  Name: Corey Wong MRN: [id number] Date of Birth: 07-04-1934   Medicare Important Message Given:  Yes     Allayne Butcher, RN 04/21/2020, 11:08 AM

## 2020-04-21 NOTE — TOC Progression Note (Signed)
Transition of Care [facility]) - Progression Note    Patient Details  Name: Corey Wong MRN: [id number] Date of Birth: July 27, 1934  Transition of Care [facility]) CM/SW Contact  Allayne Butcher, RN Phone Number: 04/21/2020, 2:43 PM  Clinical Narrative:    Confirmed with [facility] that patient is good to discharge to facility tomorrow.  Patient will be going to room E6.  RNCM will touch base with [facility] in the morning.   Expected Discharge Plan: Skilled Nursing Facility Barriers to Discharge: No Barriers Identified  Expected Discharge Plan and Services Expected Discharge Plan: Skilled Nursing Facility In-house Referral: Clinical Social Work   Post Acute Care Choice: Skilled Nursing Facility Living arrangements for the past 2 months: Single Family Home                                       Social Determinants of Health (SDOH) Interventions    Readmission Risk Interventions No flowsheet data found.

## 2020-04-21 NOTE — Progress Notes (Signed)
PROGRESS NOTE    Corey ShiStanley D Jenison   KGM:[id number]  DOB: 03/04/1935  PCP: Marina GoodellFeldpausch, Dale E, MD    DOA: 04/08/2020 LOS: 13   Brief Narrative   Corey Wong is a 86 y.o. male with medical history significant of chronic diastolic CHF, paroxysmal A. fib with history of refusing systemic anticoagulation, COPD, cor pulmonale, HTN, IDDM, morbid obesity, macular degeneration, left-sided blindness, sleep apnea diagnosed in 2014 refuses CPAP, presented with increasing short of breath, generalized weakness and leg swelling.  Patient was initially admitted for management of acute CHF decompensation.  Discharge to short-term rehab was being planned and patient had to be screened for COVID-19 which resulted positive.     Assessment & Plan   Active Problems:   Acute diastolic CHF (congestive heart failure) (HCC)   COPD with acute exacerbation (HCC)   Acute CHF (congestive heart failure) (HCC)   CHF (congestive heart failure) (HCC)   Insomnia   Chronic a-fib (HCC)   Obesity (BMI 30-39.9)   Anxiety and depression   Anasarca   Acquired thrombophilia (HCC)   Acute on chronic respiratory failure with hypoxia and hypercapnia (HCC)   COVID-19 virus infection   Acute on chronic respiratory failure with hypoxia -due to COVID-19 and COPD.   --Oxygen as needed to maintain O2 sat greater than 88%, wean as tolerated - Currently saturating 97% on 2 L oxygen via nasal cannula  COVID-19 infection with COPD exacerbation -patient tested negative for COVID on 1/6, subsequently positive when screened for SNF placement.   --Completed remdesivir, steroids,  -Continue zinc, vitamin C, inhalers per orders -- Mucinex scheduled, Tussionex as needed  Acute on chronic diastolic CHF with anasarca - Net IO Since Admission: -9,044.28 mL [04/21/20 1458] Has been transitioned from IV diuresis back to torsemide.  Torsemide on hold for persistent AKI.  Patient is clinically dry and with contraction alkalosis. --Blood  pressure lower end of normal so we will hold metoprolol -- Resumed on torsemide 20 mg daily  -- Follow renal function and electrolytes  -- Strict I/O's and daily weights -- Leg wraps for edema  Acute normocytic anemia -not present on admission.  Hemoglobin was initially 13.6, dropped to 9.6 on 1/11.  Hbg has been improving.  No evidence of bleeding.   --Hemoglobin stable since then  Hyperkalemia -K5.3 on 1/12.  Lokelma given.  Potassium 5.0  AKI -Resolved with holding torsemide.  AKI was due to diuresis.  Resolved.   Monitor BMP.    Generalized weakness -SNF recommended by therapy for short-term rehab.  Accepted at [facility], will d/c there after 10-day isolation, 1/20.   --Continue therapy here is much as possible.  Chronic A. Fib -rate controlled.  Holding metoprolol on 1/19 due to low blood pressure.  Not on anticoagulation.  Aspirin resumed.  Acquired thrombophilia secondary to A. Fib -patient is at higher risk of stroke as he is not on anticoagulation for A. Fib.  Right shoulder pain -improved.  PT and OT.  Left upper extremity and elbow swelling - chronic and at baseline per patient, not tender or warm on exam.  Chart review and discussion with family confirm this is a chronic issue.   Pt with hx of L olecrannon bursitis and also chronic osteomyelitis of the left elbow.    Depression/anxiety -continue Zoloft  Hyperlipidemia -continue Lipitor  Tobacco abuse -counseled on importance of cessation.  Nicotine patch ordered.  Insomnia -continue trazodone  Will consult palliative care for goals of care discussion and recommend outpatient  palliative care to follow at the facility at discharge  DVT prophylaxis:    Diet:  Diet Orders (From admission, onward)    Start     Ordered   04/15/20 1158  Diet Heart Room service appropriate? Yes; Fluid consistency: Thin; Fluid restriction: 1800 mL Fluid  Diet effective now       Comments: Please send finger foods, like sandwiches for  lunch, etc. Pt able to feed himself these items  Question Answer Comment  Room service appropriate? Yes   Fluid consistency: Thin   Fluid restriction: 1800 mL Fluid      04/15/20 1158            Code Status: Full Code    Subjective 04/21/20   Feeling better.  Wants to eat. Disposition Plan & Communication   Status is: Inpatient  Remains inpatient appropriate because:IV treatments appropriate due to intensity of illness or inability to take PO.  Unable to DC to SNF until 10-day isolation period for COVID.  Unsafe to return home.   Dispo: The patient is from: Home              Anticipated d/c is to: SNF              Anticipated d/c date is: 04/22/2020              Patient currently is not medically stable to d/c.   Family Communication: Updated daughter-in-law Teresa Coombs at [phone] on 1/19   Consults, Procedures, Significant Events   Consultants:   Palliative care  Procedures:   None  Antimicrobials:  Anti-infectives (From admission, onward)   Start     Dose/Rate Route Frequency Ordered Stop   04/14/20 1000  remdesivir 100 mg in sodium chloride 0.9 % 100 mL IVPB       "Followed by" Linked Group Details   100 mg 200 mL/hr over 30 Minutes Intravenous Daily 04/13/20 1004 04/17/20 1038   04/13/20 1130  remdesivir 200 mg in sodium chloride 0.9% 250 mL IVPB       "Followed by" Linked Group Details   200 mg 580 mL/hr over 30 Minutes Intravenous Once 04/13/20 1004 04/13/20 1206   04/08/20 2200  ciprofloxacin (CIPRO) tablet 500 mg  Status:  Discontinued        500 mg Oral 2 times daily 04/08/20 2048 04/09/20 1131         Objective   Vitals:   04/21/20 0651 04/21/20 0921 04/21/20 1034 04/21/20 1040  BP:  (!) 96/49 108/65   Pulse: 69 73 78   Resp: 16 16 18    Temp:  97.6 F (36.4 C)    TempSrc:      SpO2: 99% 100% 98% 97%  Weight:      Height:       No intake or output data in the 24 hours ending 04/21/20 1458 Filed Weights   04/13/20 0440 04/15/20 0533   Weight: 39.9 kg (!) 169.3 kg    Physical Exam:  General exam: sleeping comfortably, wakes easily, no acute distress Respiratory system: expiratory wheezing, normal respiratory effort. Cardiovascular system: RRR, no b/l LE edema proximal to wraps GI: soft and non-tender, +bowel sounds Extremities: b/l onychomycosis, hyperpigmentation of distal LE's, b/l upper extremity edema L>R stable.  Left elbow deformity.   Labs   Data Reviewed: I have personally reviewed following labs and imaging studies  CBC: Recent Labs  Lab 04/15/20 0541 04/16/20 0452 04/17/20 0540 04/18/20 0520  WBC 3.1* 3.4* 3.8*  --  HGB 9.9* 10.1* 10.3* 10.9*  HCT 31.3* 31.2* 32.6* 33.9*  MCV 92.3 92.6 92.4  --   PLT 164 167 163  --    Basic Metabolic Panel: Recent Labs  Lab 04/17/20 0540 04/18/20 0520 04/19/20 0721 04/20/20 0419 04/21/20 0513  NA 143 143 141 142 143  K 5.0 4.6 4.7 4.8 5.0  CL 99 100 102 102 100  CO2 37* 37* 36* 35* 37*  GLUCOSE 125* 118* 122* 122* 128*  BUN 58* 46* 46* 41* 42*  CREATININE 1.16 0.98 0.91 0.94 1.01  CALCIUM 9.1 9.3 9.3 9.2 9.2   GFR: Estimated Creatinine Clearance: 83.8 mL/min (by C-G formula based on SCr of 1.01 mg/dL). Liver Function Tests: Recent Labs  Lab 04/15/20 0541 04/16/20 0452  AST 24 27  ALT 29 37  ALKPHOS 75 73  BILITOT 1.8* 1.7*  PROT 5.4* 5.1*  ALBUMIN 2.8* 2.8*   No results for input(s): LIPASE, AMYLASE in the last 168 hours. No results for input(s): AMMONIA in the last 168 hours. Coagulation Profile: No results for input(s): INR, PROTIME in the last 168 hours. Cardiac Enzymes: No results for input(s): CKTOTAL, CKMB, CKMBINDEX, TROPONINI in the last 168 hours. BNP (last 3 results) No results for input(s): PROBNP in the last 8760 hours. HbA1C: No results for input(s): HGBA1C in the last 72 hours. CBG: No results for input(s): GLUCAP in the last 168 hours. Lipid Profile: No results for input(s): CHOL, HDL, LDLCALC, TRIG, CHOLHDL,  LDLDIRECT in the last 72 hours. Thyroid Function Tests: No results for input(s): TSH, T4TOTAL, FREET4, T3FREE, THYROIDAB in the last 72 hours. Anemia Panel: No results for input(s): VITAMINB12, FOLATE, FERRITIN, TIBC, IRON, RETICCTPCT in the last 72 hours. Sepsis Labs: No results for input(s): PROCALCITON, LATICACIDVEN in the last 168 hours.  Recent Results (from the past 240 hour(s))  SARS CORONAVIRUS 2 (TAT 6-24 HRS) Nasopharyngeal Nasopharyngeal Swab     Status: Abnormal   Collection Time: 04/12/20 11:32 AM   Specimen: Nasopharyngeal Swab  Result Value Ref Range Status   SARS Coronavirus 2 POSITIVE (A) NEGATIVE Final    Comment: (NOTE) SARS-CoV-2 target nucleic acids are DETECTED.  The SARS-CoV-2 RNA is generally detectable in upper and lower respiratory specimens during the acute phase of infection. Positive results are indicative of the presence of SARS-CoV-2 RNA. Clinical correlation with patient history and other diagnostic information is  necessary to determine patient infection status. Positive results do not rule out bacterial infection or co-infection with other viruses.  The expected result is Negative.  Fact Sheet for Patients: HairSlick.no  Fact Sheet for Healthcare Providers: quierodirigir.com  This test is not yet approved or cleared by the [place] FDA and  has been authorized for detection and/or diagnosis of SARS-CoV-2 by FDA under an Emergency Use Authorization (EUA). This EUA will remain  in effect (meaning this test can be used) for the duration of the COVID-19 declaration under Section 564(b)(1) of the Act, 21 U. S.C. section 360bbb-3(b)(1), unless the authorization is terminated or revoked sooner.   Performed at [facility], [address], [place], [place] [address]       Imaging Studies   No results found.   Medications   Scheduled Meds: . vitamin C  500 mg Oral Daily  .  aspirin EC  81 mg Oral Daily  . atorvastatin  20 mg Oral q1800  . brimonidine  1 drop Both Eyes Q8H  . dextromethorphan-guaiFENesin  1 tablet Oral BID  . enoxaparin (LOVENOX)  injection  0.5 mg/kg Subcutaneous Q24H  . ferrous sulfate  325 mg Oral BID WC  . fluticasone furoate-vilanterol  1 puff Inhalation Daily  . hydrocerin   Topical Daily  . Ipratropium-Albuterol  1 puff Inhalation Q6H  . latanoprost  1 drop Both Eyes QHS  . nicotine  21 mg Transdermal Daily  . polyethylene glycol  17 g Oral Daily  . senna-docusate  2 tablet Oral BID  . sertraline  25 mg Oral Daily  . sodium chloride flush  3 mL Intravenous Q12H  . timolol  1 drop Both Eyes BID  . torsemide  20 mg Oral Daily  . zinc sulfate  220 mg Oral Daily   Continuous Infusions: . sodium chloride Stopped (04/16/20 2105)       LOS: 13 days    Time spent: 20 minutes    Kaylena Pacifico Sherryll Burger, DO [facility]  04/21/2020, 2:58 PM    If 7PM-7AM, please contact night-coverage. How to contact the [facility] Attending or Consulting provider 7A - 7P or covering provider during after hours 7P -7A, for this patient?    1. Check the care team in [facility] and look for a) attending/consulting TR[facility] provider listed and b) the [facility] team listed 2. Log into [email or web address] and use [facility] universal password to access. If you do not have the password, please contact the hospital operator. 3. Locate the [facility] provider you are looking for under [facility] and page to a number that you can be directly reached. 4. If you still have difficulty reaching the provider, please page the [facility] (Director on Call) for the Hospitalists listed on amion for assistance.

## 2020-04-22 LAB — CBC
HCT: 32.5 % — ABNORMAL LOW (ref 39.0–52.0)
Hemoglobin: 10.3 g/dL — ABNORMAL LOW (ref 13.0–17.0)
MCH: 29.6 pg (ref 26.0–34.0)
MCHC: 31.7 g/dL (ref 30.0–36.0)
MCV: 93.4 fL (ref 80.0–100.0)
Platelets: 183 10*3/uL (ref 150–400)
RBC: 3.48 MIL/uL — ABNORMAL LOW (ref 4.22–5.81)
RDW: 15.9 % — ABNORMAL HIGH (ref 11.5–15.5)
WBC: 7 10*3/uL (ref 4.0–10.5)
nRBC: 0 % (ref 0.0–0.2)

## 2020-04-22 LAB — GLUCOSE, CAPILLARY: Glucose-Capillary: 169 mg/dL — ABNORMAL HIGH (ref 70–99)

## 2020-04-22 NOTE — Progress Notes (Signed)
Spoke to [employer] @ [facility] to give report on this pt.

## 2020-04-22 NOTE — TOC Progression Note (Signed)
Transition of Care [facility]) - Progression Note    Patient Details  Name: Corey Wong MRN: [id number] Date of Birth: 06-24-1934  [facility]) CM/SW Contact  Allayne Butcher, RN Phone Number: 04/22/2020, 11:00 AM  Clinical Narrative:     Outpatient palliative referral given to [facility] with [facility].   Expected Discharge Plan: Skilled Nursing Facility Barriers to Discharge: Barriers Resolved  Expected Discharge Plan and Services Expected Discharge Plan: Skilled Nursing Facility In-house Referral: Clinical Social Work   Post Acute Care Choice: Skilled Nursing Facility Living arrangements for the past 2 months: Single Family Home Expected Discharge Date: 04/22/20                                     Social Determinants of Health (SDOH) Interventions    Readmission Risk Interventions No flowsheet data found.

## 2020-04-22 NOTE — Progress Notes (Signed)
[facility] Room 109 [facility]) Hospital Liaison RN note:  Received new referral for [facility] out patient palliative program to follow post discharge from [place], TOC. Patient information given to referral. Plan is to discharge today to [facility].  Thank you for this referral.  Cyndra Numbers, RN [facility] Liaison [phone]

## 2020-04-22 NOTE — Discharge Summary (Addendum)
Corey Wong at [facility]   PATIENT NAME: Corey Wong    MR#:  [id number]  DATE OF BIRTH:  [birth year]  DATE OF ADMISSION:  04/08/2020   ADMITTING PHYSICIAN: Corey General, MD  DATE OF DISCHARGE: 04/22/2020  PRIMARY CARE PHYSICIAN: Corey Goodell, MD   ADMISSION DIAGNOSIS:  CHF (congestive heart failure) (HCC) [I50.9] Altered mental status, unspecified altered mental status type [R41.82] Acute on chronic congestive heart failure, unspecified heart failure type (HCC) [I50.9] DISCHARGE DIAGNOSIS:  Active Problems:   Acute diastolic CHF (congestive heart failure) (HCC)   COPD with acute exacerbation (HCC)   Acute CHF (congestive heart failure) (HCC)   CHF (congestive heart failure) (HCC)   Insomnia   Chronic a-fib (HCC)   Obesity (BMI 30-39.9)   Anxiety and depression   Anasarca   Acquired thrombophilia (HCC)   Acute on chronic respiratory failure with hypoxia and hypercapnia (HCC)   COVID-19 virus infection  SECONDARY DIAGNOSIS:   Past Medical History:  Diagnosis Date  . Blind left eye   . Hypertension   . Macular degeneration of right eye    HOSPITAL COURSE:  Corey Pargas Snowis a 86 y.o.malewith medical history significant ofchronic diastolic CHF, paroxysmal A. fib with history of refusing systemic anticoagulation, COPD, cor pulmonale, HTN,IDDM,morbid obesity, macular degeneration, left-sided blindness,sleep apnea diagnosed in 2014 refuses CPAP, presented with increasing short of breath, generalized weakness and leg swelling.  Patient was initially admitted for management of acute CHF decompensation.  Discharge to short-term rehab was being planned and patient had to be screened for COVID-19 which resulted positive.  Acute on chronic respiratory failure with hypoxia -due to COVID-19 and COPD.   Provide 2 L oxygen as needed to maintain oxygen saturations above 88%  COVID-19 infection with COPD exacerbation -patient tested negative for COVID on 1/6,  subsequently positive when screened for SNF placement.   --Completed remdesivir, steroids  Acute on chronic diastolic CHF with anasarca - Diuresed about 10 liters neg fluid balance at D/C -- Leg wraps for edema  Acute normocytic anemia -not present on admission. Stable hemodynamics.  No evidence of bleeding.    Hyperkalemia -K5.3 on 1/12.  Lokelma given.  Potassium 5.0  AKI -Resolved with holding diuretics.  AKI was due to diuresis  Generalized weakness -SNF recommended by therapy for short-term rehab.  Accepted at [facility].    Chronic A. Fib -metoprolol for rate control.  Not on anticoagulation due to high risk for bleeding and falls. Will need d/w his outpt cardio to decide need for anticoagulation.  Per cardiology's outpatient note patient had refused in past  Acquired thrombophilia secondary to A. Fib -patient is at higher risk of stroke as he is not on anticoagulation for A. Fib.  Right shoulder pain -improved.  Continue therapy and rehab  Left upper extremity and elbow swelling - chronic and at baseline per patient, not tender or warm on exam.  Chart review and discussion with family confirm this is a chronic issue.   Pt with hx of L olecrannon bursitis and also chronic osteomyelitis of the left elbow.    Depression/anxiety -continue Zoloft  Hyperlipidemia -continue Lipitor  Tobacco abuse -counseled on importance of cessation.  Insomnia -continue trazodone  Outpatient palliative care.  Overall poor prognosis.   DISCHARGE CONDITIONS:  Stable CONSULTS OBTAINED:   DRUG ALLERGIES:   Allergies  Allergen Reactions  . Trazodone Anxiety    Altered mental status  . Linezolid Other (See Comments)    Sore on the inside  of mouth and lips   . Sulfa Antibiotics Other (See Comments) and Rash    Other Reaction: hyperactivity   DISCHARGE MEDICATIONS:   Allergies as of 04/22/2020      Reactions   Trazodone Anxiety   Altered mental status   Linezolid Other (See  Comments)   Sore on the inside of mouth and lips    Sulfa Antibiotics Other (See Comments), Rash   Other Reaction: hyperactivity      Medication List    TAKE these medications   aspirin 81 MG EC tablet Take 81 mg by mouth daily.   atorvastatin 20 MG tablet Commonly known as: LIPITOR Take 20 mg by mouth daily.   brimonidine 0.2 % ophthalmic solution Commonly known as: ALPHAGAN Place 1 drop into both eyes daily.   furosemide 20 MG tablet Commonly known as: LASIX Take 20 mg by mouth 3 (three) times daily.   Lumigan 0.01 % Soln Generic drug: bimatoprost Place into both eyes at bedtime.   metoprolol succinate 25 MG 24 hr tablet Commonly known as: TOPROL-XL Take 25 mg by mouth daily.   sertraline 50 MG tablet Commonly known as: ZOLOFT Take 50 mg by mouth daily.   timolol 0.5 % ophthalmic solution Commonly known as: TIMOPTIC Place 1 drop into both eyes 2 (two) times daily.   Wixela Inhub 250-50 MCG/DOSE Aepb Generic drug: Fluticasone-Salmeterol Inhale 1 puff into the lungs 2 (two) times daily.      DISCHARGE INSTRUCTIONS:   DIET:  Cardiac diet DISCHARGE CONDITION:  Stable ACTIVITY:  Activity as tolerated OXYGEN:  Home Oxygen: No.  Oxygen Delivery: room air he is saturating okay but needing 2 L oxygen mainly for comfort as he gets little anxious and his oxygen saturation will drop below 88 at times in which case he needs 2 L oxygen. DISCHARGE LOCATION:  nursing home with palliative care to follow  If you experience worsening of your admission symptoms, develop shortness of breath, life threatening emergency, suicidal or homicidal thoughts you must seek medical attention immediately by calling 911 or calling your MD immediately  if symptoms less severe.  You Must read complete instructions/literature along with all the possible adverse reactions/side effects for all the Medicines you take and that have been prescribed to you. Take any new Medicines after you have  completely understood and accpet all the possible adverse reactions/side effects.   Please note  You were cared for by a hospitalist during your hospital stay. If you have any questions about your discharge medications or the care you received while you were in the hospital after you are discharged, you can call the unit and asked to speak with the hospitalist on call if the hospitalist that took care of you is not available. Once you are discharged, your primary care physician will handle any further medical issues. Please note that NO REFILLS for any discharge medications will be authorized once you are discharged, as it is imperative that you return to your primary care physician (or establish a relationship with a primary care physician if you do not have one) for your aftercare needs so that they can reassess your need for medications and monitor your lab values.    On the day of Discharge:  VITAL SIGNS:  Blood pressure (!) 127/106, pulse 63, temperature 98 F (36.7 C), resp. rate 20, height 5\' 8"  (1.727 m), weight (!) 169.3 kg, SpO2 99 %. PHYSICAL EXAMINATION:  Wong:  86 y.o.-year-old patient lying in the bed with no acute  distress.  EYES: Pupils equal, round, reactive to light and accommodation. No scleral icterus. Extraocular muscles intact.  HEENT: Head atraumatic, normocephalic. Oropharynx and nasopharynx clear.  NECK:  Supple, no jugular venous distention. No thyroid enlargement, no tenderness.  LUNGS: Normal breath sounds bilaterally, no wheezing, rales,rhonchi or crepitation. No use of accessory muscles of respiration.  CARDIOVASCULAR: S1, S2 normal. No murmurs, rubs, or gallops.  ABDOMEN: Soft, non-tender, non-distended. Bowel sounds present. No organomegaly or mass.  EXTREMITIES: No pedal edema, cyanosis, or clubbing.  NEUROLOGIC: Cranial nerves II through XII are intact. Muscle strength 5/5 in all extremities. Sensation intact. Gait not checked.  PSYCHIATRIC: The patient is  alert and oriented x 3.  SKIN: No obvious rash, lesion, or ulcer.  DATA REVIEW:   CBC Recent Labs  Lab 04/22/20 0532  WBC 7.0  HGB 10.3*  HCT 32.5*  PLT 183    Chemistries  Recent Labs  Lab 04/16/20 0452 04/17/20 0540 04/21/20 0513  NA 144   < > 143  K 4.3   < > 5.0  CL 101   < > 100  CO2 37*   < > 37*  GLUCOSE 134*   < > 128*  BUN 62*   < > 42*  CREATININE 1.38*   < > 1.01  CALCIUM 9.2   < > 9.2  AST 27  --   --   ALT 37  --   --   ALKPHOS 73  --   --   BILITOT 1.7*  --   --    < > = values in this interval not displayed.     Outpatient follow-up  Contact information for follow-up providers    [facility] Follow up on 04/26/2020.   Specialty: Cardiology Why: at 1:00pm. Enter through the Medical Mall entrance Contact information: [address] [place] [address] [phone]       Corey Goodell, MD. Schedule an appointment as soon as possible for a visit in 1 week(s).   Specialty: Family Medicine Contact information: [address] [place] [address] [phone]        Dalia Heading, MD. Schedule an appointment as soon as possible for a visit in 2 week(s).   Specialty: Cardiology Contact information: [address] [place] [address] [phone]            Contact information for after-discharge care    Destination    [facility] .   Service: Skilled Nursing Contact information: [address] [facility] [place] [address] [phone]                  30 Day Unplanned Readmission Risk Score   Flowsheet Row ED to Hosp-Admission (Current) from 04/08/2020 in [facility] (1C)  30 Day Unplanned Readmission Risk Score (%) 16.02 Filed at 04/22/2020 0801     This score is the patient's risk of an unplanned readmission within 30 days of being discharged (0 -100%). The score is based on  dignosis, age, lab data, medications, orders, and past utilization.   Low:  0-14.9   Medium: 15-21.9   High: 22-29.9   Extreme: 30 and above         Management plans discussed with the patient, family and they are in agreement.  CODE STATUS: Full Code   TOTAL TIME TAKING CARE OF THIS PATIENT: 45 minutes.    Geraldyne Barraclough Rosita Fire.D  on 04/22/2020 at 10:43 AM  [facility]   CC: Primary care physician; Corey Goodell, MD   Note: This dictation was prepared with Dragon dictation along with smaller phrase technology. Any transcriptional errors that result from this process are unintentional.

## 2020-04-22 NOTE — TOC Progression Note (Signed)
Transition of Care [facility]) - Progression Note    Patient Details  Name: Corey Wong MRN: [id number] Date of Birth: 1935-03-10  [facility] CM/SW Contact  Allayne Butcher, RN Phone Number: 04/22/2020, 10:53 AM  Clinical Narrative:    EMS has been arranged.  They could not give a time estimate for pick up.     Expected Discharge Plan: Skilled Nursing Facility Barriers to Discharge: Barriers Resolved  Expected Discharge Plan and Services Expected Discharge Plan: Skilled Nursing Facility In-house Referral: Clinical Social Work   Post Acute Care Choice: Skilled Nursing Facility Living arrangements for the past 2 months: Single Family Home Expected Discharge Date: 04/22/20                                     Social Determinants of Health (SDOH) Interventions    Readmission Risk Interventions No flowsheet data found.

## 2020-04-22 NOTE — Progress Notes (Signed)
PMT consult received and chart reviewed. Plan is for discharge to SNF rehab today. Recommend outpatient palliative referral for initial GOC discussion. Discussed with RN CM.   NO CHARGE  Vennie Homans, DNP, FNP-C Palliative Medicine Team  Phone: [phone] Fax: [phone]

## 2020-04-22 NOTE — TOC Transition Note (Signed)
Transition of Care [facility]) - CM/SW Discharge Note   Patient Details  Name: Corey Wong MRN: [id number] Date of Birth: 04-27-1934  Transition [facility] Care [facility]) CM/SW Contact:  Allayne Butcher, RN Phone Number: 04/22/2020, 8:50 AM   Clinical Narrative:    Patient will discharge to [facility] today.  Patient will be going to room E6.  Bedside RN will call report to [phone].  Once discharge paperwork competed [facility] will arrange EMS transport.  Family is aware of discharge plans for today.   Final next level of care: Skilled Nursing Facility Barriers to Discharge: Barriers Resolved   Patient Goals and CMS Choice Patient states their goals for this hospitalization and ongoing recovery are:: "to be able to do stuff by myself" "I want to walk with a walker" CMS Medicare.gov Compare Post Acute Care list provided to:: Patient Choice offered to / list presented to : Patient  Discharge Placement              Patient chooses bed at: [facility] Patient to be transferred to facility by: [facility] Name of family member notified: Jedaiah Rathbun Patient and family notified of of transfer: 04/22/20  Discharge Plan and Services In-house Referral: Clinical Social Work   Post Acute Care Choice: Skilled Nursing Facility                               Social Determinants of Health (SDOH) Interventions     Readmission Risk Interventions No flowsheet data found.

## 2020-04-26 ENCOUNTER — Ambulatory Visit: Payer: Medicare Other | Admitting: Family

## 2020-05-11 ENCOUNTER — Ambulatory Visit (INDEPENDENT_AMBULATORY_CARE_PROVIDER_SITE_OTHER): Payer: Medicare Other | Admitting: Vascular Surgery

## 2020-05-13 ENCOUNTER — Ambulatory Visit: Payer: Medicare Other | Admitting: Family

## 2020-06-01 ENCOUNTER — Ambulatory Visit: Payer: Medicare Other | Admitting: Family

## 2020-10-01 DEATH — deceased

## 2022-04-21 IMAGING — US US EXTREM  UP VENOUS*L*
1 series · 13 of 24 positions shown · non-contrast
Comparison: MRI of the left elbow on 05/19/2019

CLINICAL DATA: Pain and edema the left upper extremity with history
of osteomyelitis of the left elbow.



[Series 1: us extrem up venous*left* · 0.07mm/px · 13 of 31 slices shown]
[im 1/31]
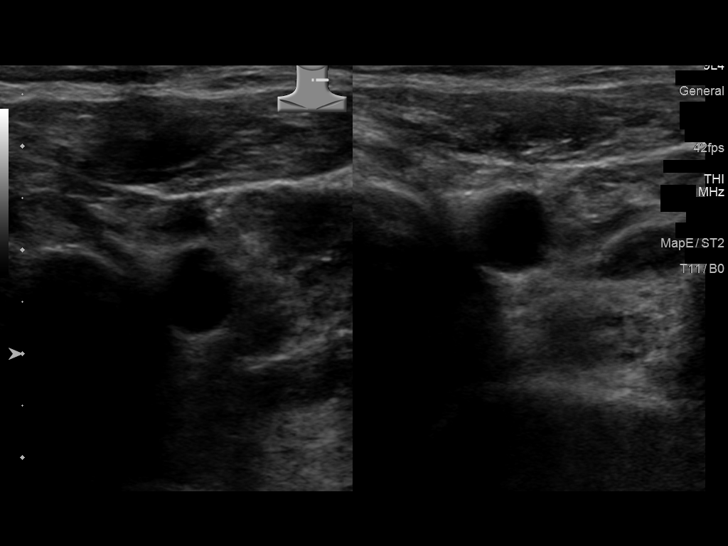
[im 3/31]
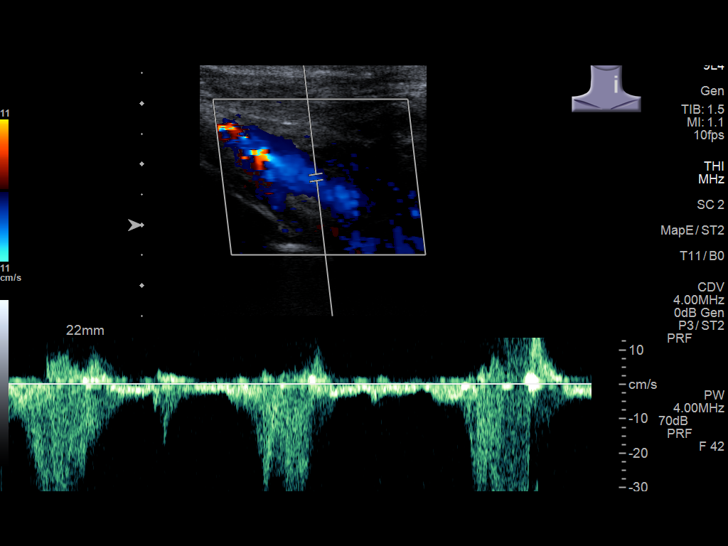
[im 6/31]
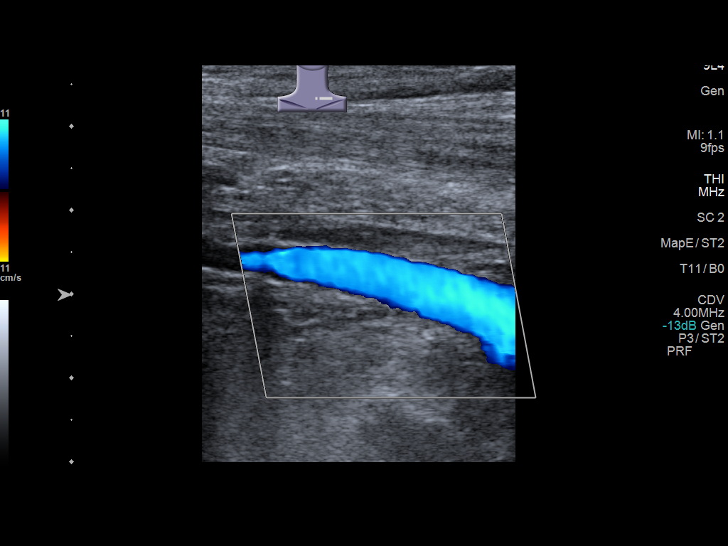
[im 8/31]
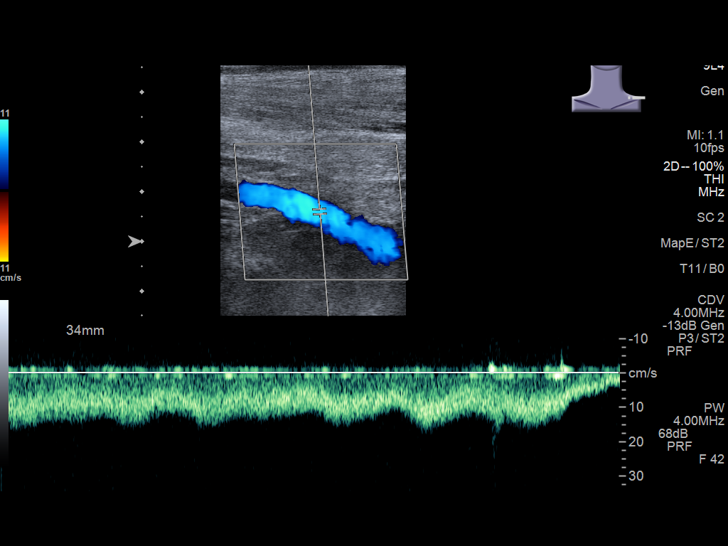
[im 11/31]
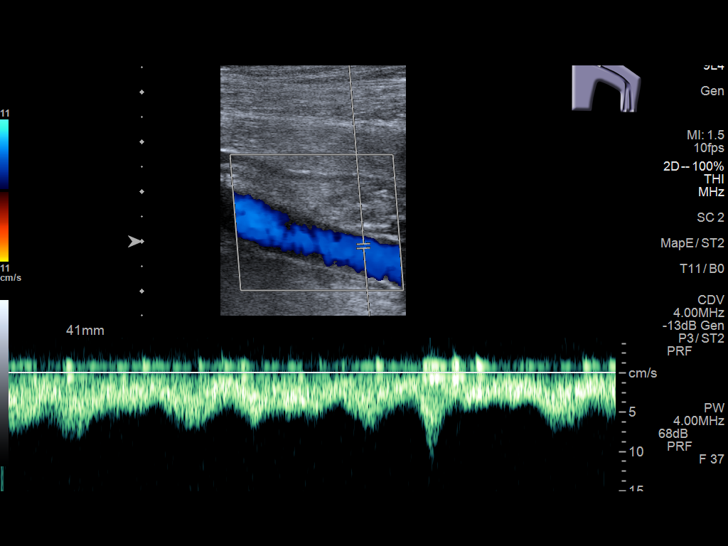
[im 14/31]
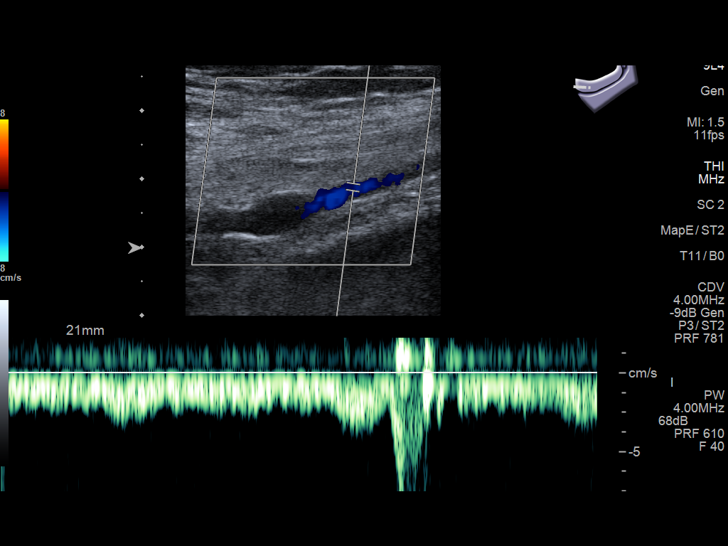
[im 16/31]
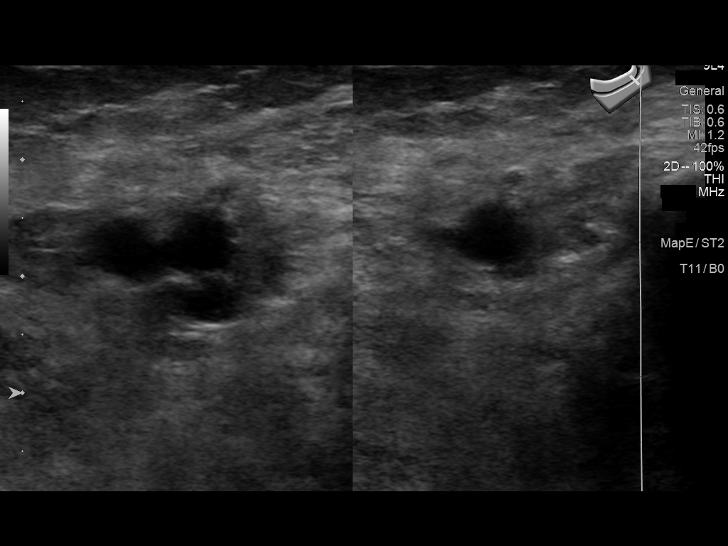
[im 17/31]
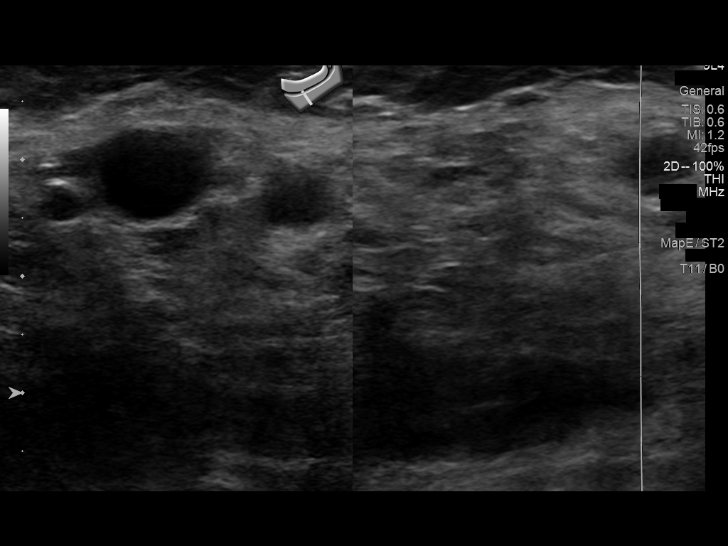
[im 20/31]
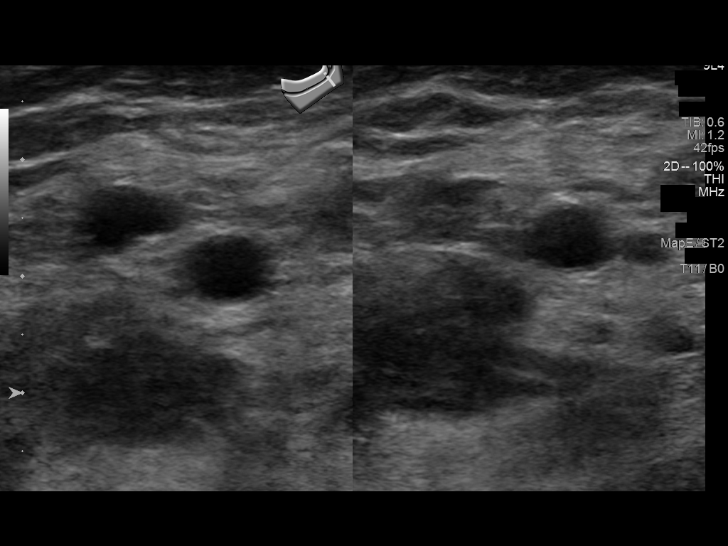
[im 23/31]
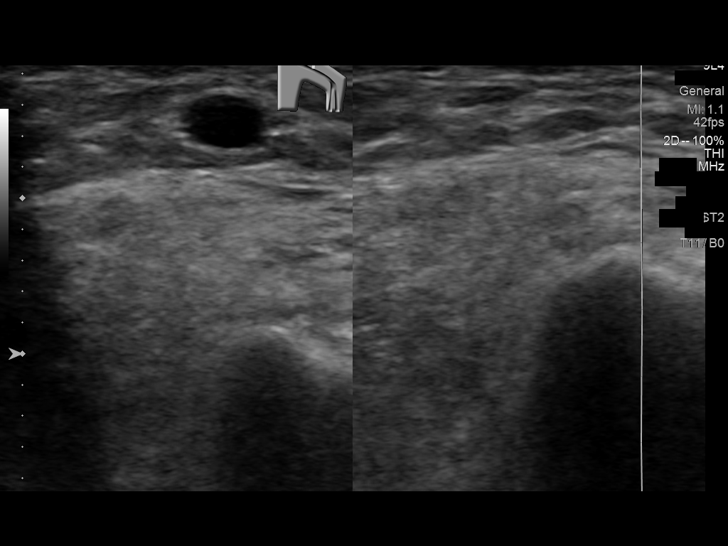
[im 25/31]
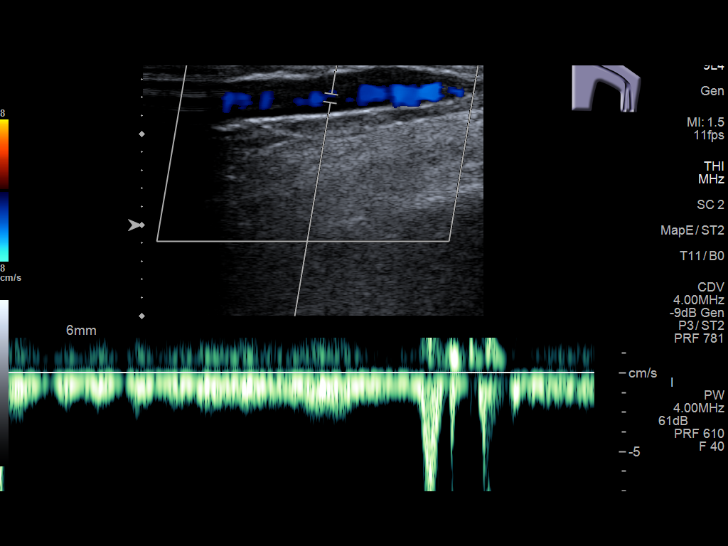
[im 28/31]
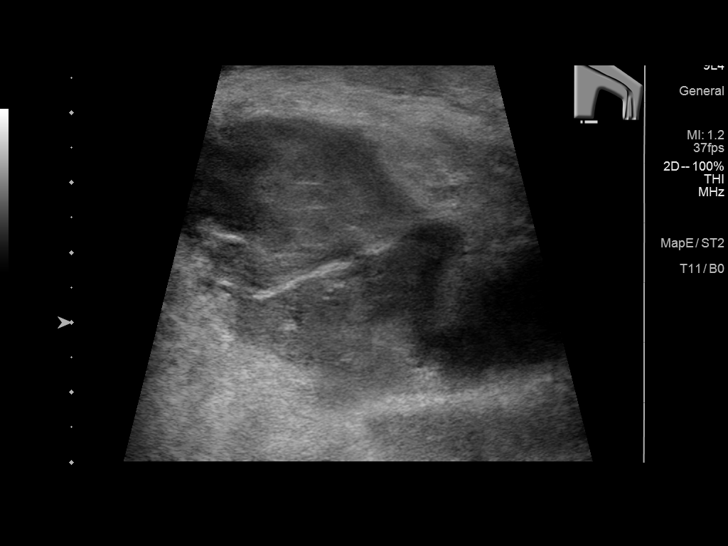
[im 31/31]
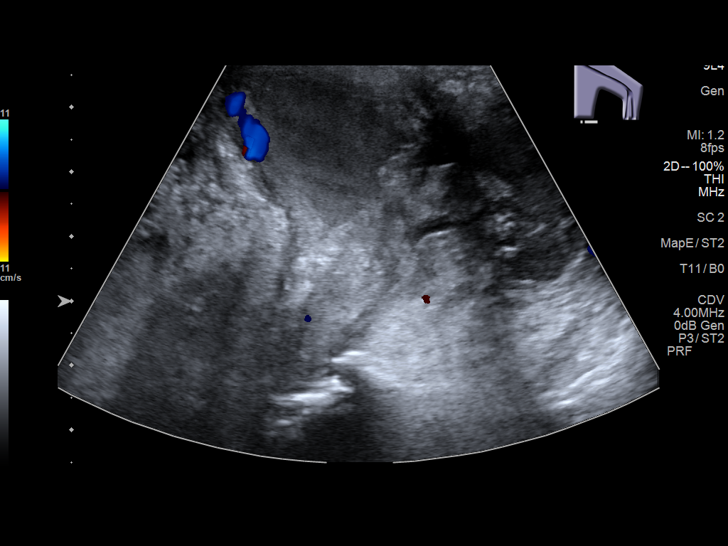

[13 of 24 positions shown; findings below may reference images not displayed]

FINDINGS: Contralateral Subclavian Vein: Respiratory phasicity is normal and
symmetric with the symptomatic side. No evidence of thrombus. Normal
compressibility.

Internal Jugular Vein: No evidence of thrombus. Normal
compressibility, respiratory phasicity and response to augmentation.

Subclavian Vein: No evidence of thrombus. Normal compressibility,
respiratory phasicity and response to augmentation.

Axillary Vein: No evidence of thrombus. Normal compressibility,
respiratory phasicity and response to augmentation.

Cephalic Vein: No evidence of thrombus. Normal compressibility,
respiratory phasicity and response to augmentation.

Basilic Vein: No evidence of thrombus. Normal compressibility,
respiratory phasicity and response to augmentation.

Brachial Veins: No evidence of thrombus. Normal compressibility,
respiratory phasicity and response to augmentation.

Radial Veins: No evidence of thrombus. Normal compressibility,
respiratory phasicity and response to augmentation.

Ulnar Veins: No evidence of thrombus. Normal compressibility,
respiratory phasicity and response to augmentation.

Venous Reflux:  None visualized.

Other Findings: No evidence of superficial thrombophlebitis. There
are visible complex fluid collection centered around the left elbow.
IMPRESSION: 1. No evidence of DVT in the left upper extremity.
2. Complex fluid collections around the left elbow which have been
imaged previously and likely consistent with known chronic
osteomyelitis of the left elbow joint.

## 2022-10-21 IMAGING — CR DG CHEST 1V
1 series · 1 of 1 positions shown · non-contrast
Comparison: None.

CLINICAL DATA: CHF, shortness of breath.

EXAM:
CHEST  1 VIEW

[dg chest 1 view]
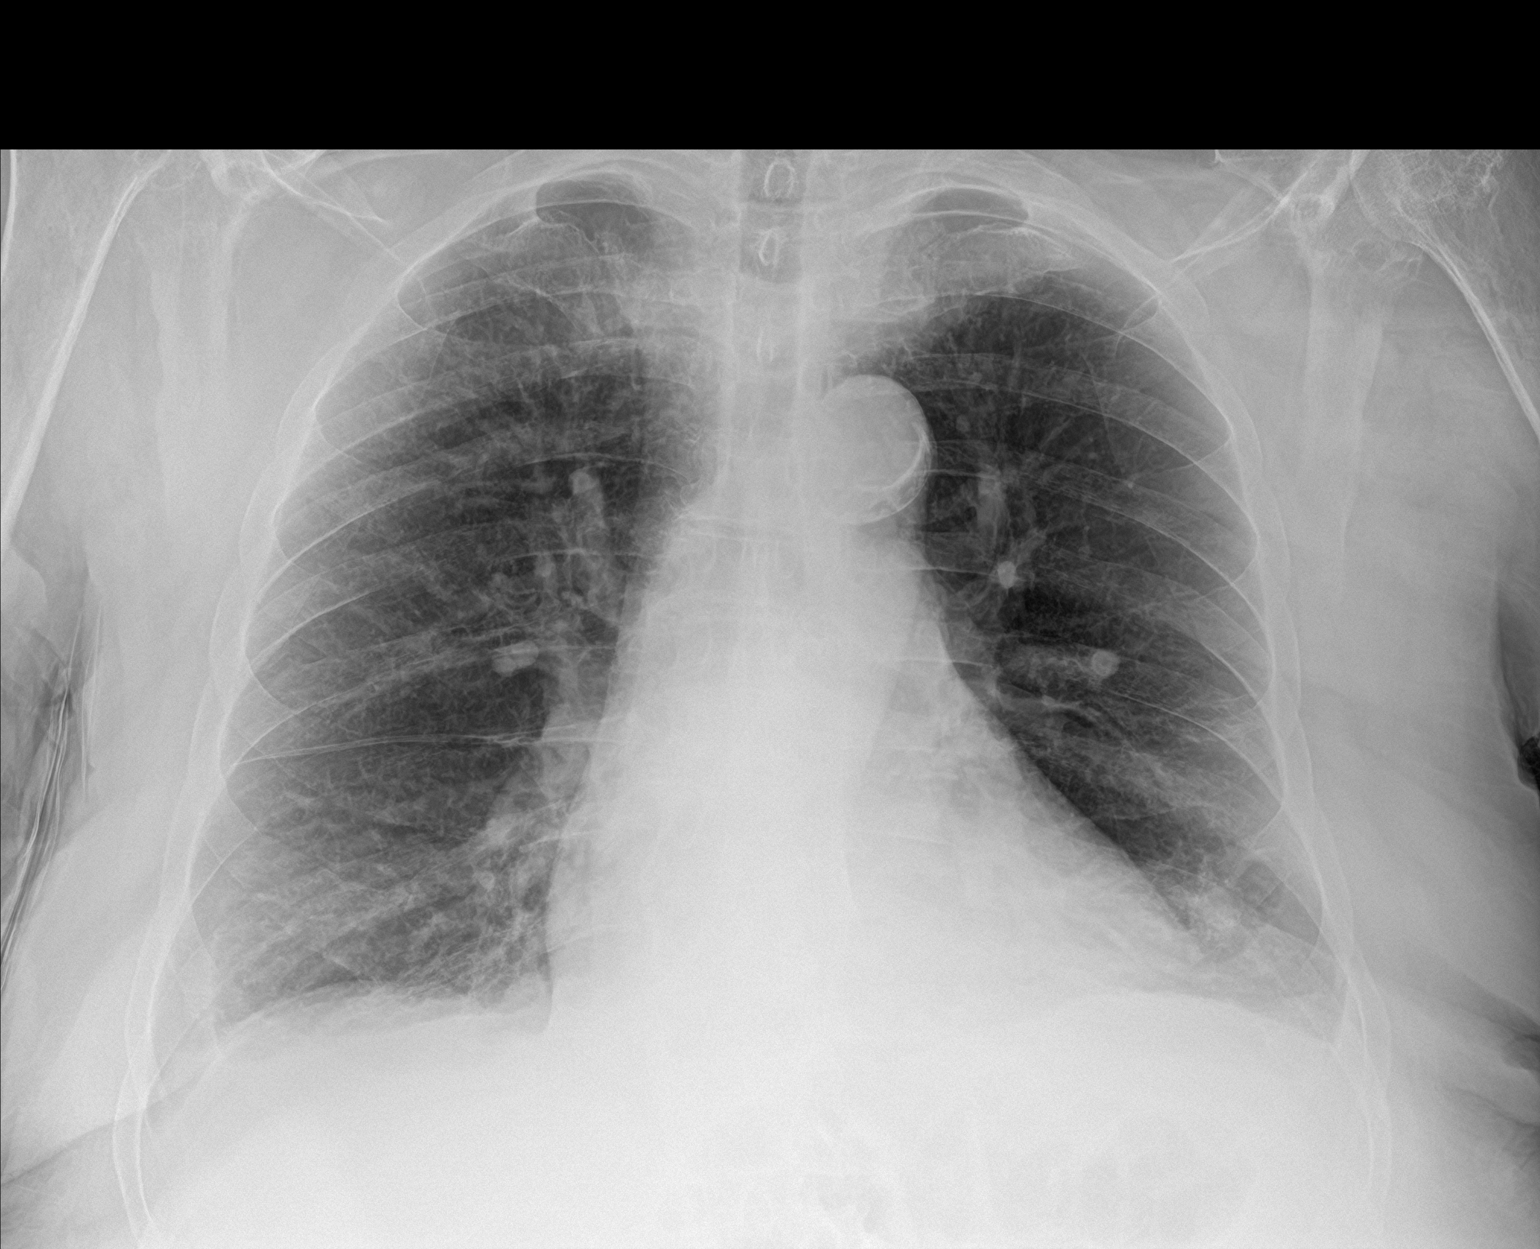

[1 of 1 positions shown; findings below may reference images not displayed]

FINDINGS: Mild enlargement the cardiac silhouette. Central vascular
congestion. Aortic atherosclerosis. Suspected small bilateral
pleural effusions. No visible pneumothorax. Retrocardiac opacities.
IMPRESSION: 1. Mild cardiomegaly, central pulmonary vascular congestion, and
suspected small bilateral pleural effusions.
2. Retrocardiac opacities, which could represent atelectasis,
aspiration, and/or pneumonia.
